# Patient Record
Sex: Female | Born: 1945 | Race: White | Hispanic: No | Marital: Married | Smoking: Former smoker
Health system: Southern US, Community
[De-identification: ages and names within clinical notes are randomized; demographics above are authoritative.]

## PROBLEM LIST (undated history)

## (undated) DIAGNOSIS — C50919 Malignant neoplasm of unspecified site of unspecified female breast: Secondary | ICD-10-CM

## (undated) DIAGNOSIS — H409 Unspecified glaucoma: Secondary | ICD-10-CM

## (undated) HISTORY — DX: Unspecified glaucoma: H40.9

## (undated) HISTORY — PX: TONSILLECTOMY: SUR1361

## (undated) HISTORY — DX: Malignant neoplasm of unspecified site of unspecified female breast: C50.919

## (undated) HISTORY — PX: TUBAL LIGATION: SHX77

---
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
## 1999-04-24 ENCOUNTER — Other Ambulatory Visit: Admission: RE | Admit: 1999-04-24 | Discharge: 1999-04-24 | Payer: Self-pay | Admitting: Gastroenterology

## 1999-12-02 ENCOUNTER — Ambulatory Visit (HOSPITAL_COMMUNITY): Admission: RE | Admit: 1999-12-02 | Discharge: 1999-12-02 | Payer: Self-pay | Admitting: Neurosurgery

## 1999-12-02 ENCOUNTER — Encounter: Payer: Self-pay | Admitting: Neurosurgery

## 2000-01-21 ENCOUNTER — Encounter: Payer: Self-pay | Admitting: Neurosurgery

## 2000-01-21 ENCOUNTER — Ambulatory Visit (HOSPITAL_COMMUNITY): Admission: RE | Admit: 2000-01-21 | Discharge: 2000-01-21 | Payer: Self-pay | Admitting: Neurosurgery

## 2000-06-17 ENCOUNTER — Other Ambulatory Visit: Admission: RE | Admit: 2000-06-17 | Discharge: 2000-06-17 | Payer: Self-pay | Admitting: Obstetrics & Gynecology

## 2001-06-20 ENCOUNTER — Other Ambulatory Visit: Admission: RE | Admit: 2001-06-20 | Discharge: 2001-06-20 | Payer: Self-pay | Admitting: Obstetrics & Gynecology

## 2001-11-06 ENCOUNTER — Encounter: Payer: Self-pay | Admitting: Emergency Medicine

## 2001-11-06 ENCOUNTER — Inpatient Hospital Stay (HOSPITAL_COMMUNITY): Admission: EM | Admit: 2001-11-06 | Discharge: 2001-11-07 | Payer: Self-pay | Admitting: Emergency Medicine

## 2002-06-25 ENCOUNTER — Other Ambulatory Visit: Admission: RE | Admit: 2002-06-25 | Discharge: 2002-06-25 | Payer: Self-pay | Admitting: Obstetrics & Gynecology

## 2003-07-01 ENCOUNTER — Other Ambulatory Visit: Admission: RE | Admit: 2003-07-01 | Discharge: 2003-07-01 | Payer: Self-pay | Admitting: Obstetrics & Gynecology

## 2004-07-24 ENCOUNTER — Other Ambulatory Visit: Admission: RE | Admit: 2004-07-24 | Discharge: 2004-07-24 | Payer: Self-pay | Admitting: Obstetrics & Gynecology

## 2005-08-13 ENCOUNTER — Other Ambulatory Visit: Admission: RE | Admit: 2005-08-13 | Discharge: 2005-08-13 | Payer: Self-pay | Admitting: Obstetrics & Gynecology

## 2006-05-16 ENCOUNTER — Encounter (INDEPENDENT_AMBULATORY_CARE_PROVIDER_SITE_OTHER): Payer: Self-pay | Admitting: Specialist

## 2006-05-16 ENCOUNTER — Ambulatory Visit (HOSPITAL_COMMUNITY): Admission: RE | Admit: 2006-05-16 | Discharge: 2006-05-16 | Payer: Self-pay | Admitting: Obstetrics & Gynecology

## 2006-11-04 ENCOUNTER — Emergency Department (HOSPITAL_COMMUNITY): Admission: EM | Admit: 2006-11-04 | Discharge: 2006-11-04 | Payer: Self-pay | Admitting: Emergency Medicine

## 2008-07-24 ENCOUNTER — Encounter: Admission: RE | Admit: 2008-07-24 | Discharge: 2008-07-24 | Payer: Self-pay | Admitting: Obstetrics & Gynecology

## 2008-07-24 ENCOUNTER — Encounter (INDEPENDENT_AMBULATORY_CARE_PROVIDER_SITE_OTHER): Payer: Self-pay | Admitting: Obstetrics & Gynecology

## 2008-07-24 ENCOUNTER — Encounter (INDEPENDENT_AMBULATORY_CARE_PROVIDER_SITE_OTHER): Payer: Self-pay | Admitting: Diagnostic Radiology

## 2008-08-01 ENCOUNTER — Encounter: Admission: RE | Admit: 2008-08-01 | Discharge: 2008-08-01 | Payer: Self-pay | Admitting: Obstetrics & Gynecology

## 2008-08-02 ENCOUNTER — Encounter: Admission: RE | Admit: 2008-08-02 | Discharge: 2008-08-02 | Payer: Self-pay | Admitting: Surgery

## 2008-08-05 ENCOUNTER — Encounter: Admission: RE | Admit: 2008-08-05 | Discharge: 2008-08-05 | Payer: Self-pay | Admitting: Surgery

## 2008-08-05 ENCOUNTER — Encounter (INDEPENDENT_AMBULATORY_CARE_PROVIDER_SITE_OTHER): Payer: Self-pay | Admitting: Diagnostic Radiology

## 2008-08-05 ENCOUNTER — Encounter: Admission: RE | Admit: 2008-08-05 | Discharge: 2008-08-05 | Payer: Self-pay | Admitting: Obstetrics & Gynecology

## 2008-08-07 ENCOUNTER — Encounter: Admission: RE | Admit: 2008-08-07 | Discharge: 2008-08-07 | Payer: Self-pay | Admitting: Surgery

## 2008-08-07 ENCOUNTER — Encounter (INDEPENDENT_AMBULATORY_CARE_PROVIDER_SITE_OTHER): Payer: Self-pay | Admitting: Surgery

## 2008-08-07 ENCOUNTER — Ambulatory Visit (HOSPITAL_COMMUNITY): Admission: RE | Admit: 2008-08-07 | Discharge: 2008-08-07 | Payer: Self-pay | Admitting: Surgery

## 2008-08-07 HISTORY — PX: BREAST LUMPECTOMY: SHX2

## 2008-08-27 ENCOUNTER — Ambulatory Visit: Payer: Self-pay | Admitting: Oncology

## 2008-08-28 LAB — CBC WITH DIFFERENTIAL/PLATELET
BASO%: 1 % (ref 0.0–2.0)
EOS%: 7.7 % — ABNORMAL HIGH (ref 0.0–7.0)
HCT: 40 % (ref 34.8–46.6)
LYMPH%: 33.3 % (ref 14.0–49.7)
MCH: 32.6 pg (ref 25.1–34.0)
MCHC: 34.4 g/dL (ref 31.5–36.0)
MCV: 94.8 fL (ref 79.5–101.0)
MONO%: 8.5 % (ref 0.0–14.0)
NEUT%: 49.5 % (ref 38.4–76.8)
Platelets: 254 10*3/uL (ref 145–400)
RBC: 4.22 10*6/uL (ref 3.70–5.45)

## 2008-08-29 ENCOUNTER — Encounter: Admission: RE | Admit: 2008-08-29 | Discharge: 2008-08-29 | Payer: Self-pay | Admitting: Oncology

## 2008-08-30 LAB — COMPREHENSIVE METABOLIC PANEL
ALT: 29 U/L (ref 0–35)
AST: 30 U/L (ref 0–37)
Alkaline Phosphatase: 63 U/L (ref 39–117)
CO2: 26 mEq/L (ref 19–32)
Creatinine, Ser: 0.85 mg/dL (ref 0.40–1.20)
Sodium: 141 mEq/L (ref 135–145)
Total Bilirubin: 1.1 mg/dL (ref 0.3–1.2)

## 2008-08-30 LAB — LACTATE DEHYDROGENASE: LDH: 193 U/L (ref 94–250)

## 2008-09-05 ENCOUNTER — Ambulatory Visit: Admission: RE | Admit: 2008-09-05 | Discharge: 2008-10-04 | Payer: Self-pay | Admitting: Radiation Oncology

## 2008-09-05 ENCOUNTER — Ambulatory Visit (HOSPITAL_COMMUNITY): Admission: RE | Admit: 2008-09-05 | Discharge: 2008-09-05 | Payer: Self-pay | Admitting: Oncology

## 2008-09-12 LAB — CBC WITH DIFFERENTIAL/PLATELET
Basophils Absolute: 0.1 10*3/uL (ref 0.0–0.1)
Eosinophils Absolute: 0.4 10*3/uL (ref 0.0–0.5)
HGB: 13.8 g/dL (ref 11.6–15.9)
LYMPH%: 35.1 % (ref 14.0–49.7)
MONO#: 0.4 10*3/uL (ref 0.1–0.9)
NEUT#: 2 10*3/uL (ref 1.5–6.5)
Platelets: 241 10*3/uL (ref 145–400)
RBC: 4.21 10*6/uL (ref 3.70–5.45)
WBC: 4.4 10*3/uL (ref 3.9–10.3)

## 2008-09-13 LAB — COMPREHENSIVE METABOLIC PANEL
AST: 30 U/L (ref 0–37)
Albumin: 4.7 g/dL (ref 3.5–5.2)
BUN: 16 mg/dL (ref 6–23)
Calcium: 9.8 mg/dL (ref 8.4–10.5)
Chloride: 104 mEq/L (ref 96–112)
Glucose, Bld: 108 mg/dL — ABNORMAL HIGH (ref 70–99)
Potassium: 4.1 mEq/L (ref 3.5–5.3)
Sodium: 141 mEq/L (ref 135–145)
Total Protein: 7.4 g/dL (ref 6.0–8.3)

## 2008-09-13 LAB — VITAMIN D 25 HYDROXY (VIT D DEFICIENCY, FRACTURES): Vit D, 25-Hydroxy: 54 ng/mL (ref 30–89)

## 2008-09-23 LAB — BASIC METABOLIC PANEL
CO2: 22 mEq/L (ref 19–32)
Calcium: 9.4 mg/dL (ref 8.4–10.5)
Chloride: 102 mEq/L (ref 96–112)
Glucose, Bld: 123 mg/dL — ABNORMAL HIGH (ref 70–99)
Sodium: 137 mEq/L (ref 135–145)

## 2008-09-23 LAB — CBC WITH DIFFERENTIAL/PLATELET
Eosinophils Absolute: 0 10*3/uL (ref 0.0–0.5)
HCT: 39.5 % (ref 34.8–46.6)
LYMPH%: 9.9 % — ABNORMAL LOW (ref 14.0–49.7)
MONO#: 0.5 10*3/uL (ref 0.1–0.9)
NEUT#: 14.7 10*3/uL — ABNORMAL HIGH (ref 1.5–6.5)
NEUT%: 86.7 % — ABNORMAL HIGH (ref 38.4–76.8)
Platelets: 222 10*3/uL (ref 145–400)
WBC: 16.9 10*3/uL — ABNORMAL HIGH (ref 3.9–10.3)
lymph#: 1.7 10*3/uL (ref 0.9–3.3)

## 2008-10-07 LAB — COMPREHENSIVE METABOLIC PANEL
ALT: 29 U/L (ref 0–35)
AST: 23 U/L (ref 0–37)
Alkaline Phosphatase: 67 U/L (ref 39–117)
BUN: 14 mg/dL (ref 6–23)
Calcium: 9.6 mg/dL (ref 8.4–10.5)
Chloride: 109 mEq/L (ref 96–112)
Creatinine, Ser: 0.65 mg/dL (ref 0.40–1.20)
Total Bilirubin: 0.5 mg/dL (ref 0.3–1.2)

## 2008-10-07 LAB — CBC WITH DIFFERENTIAL/PLATELET
BASO%: 0.1 % (ref 0.0–2.0)
Basophils Absolute: 0 10*3/uL (ref 0.0–0.1)
EOS%: 0.1 % (ref 0.0–7.0)
HCT: 33.5 % — ABNORMAL LOW (ref 34.8–46.6)
HGB: 11.7 g/dL (ref 11.6–15.9)
LYMPH%: 8.2 % — ABNORMAL LOW (ref 14.0–49.7)
MCH: 32.1 pg (ref 25.1–34.0)
MCHC: 34.9 g/dL (ref 31.5–36.0)
MCV: 91.8 fL (ref 79.5–101.0)
MONO%: 3.6 % (ref 0.0–14.0)
NEUT%: 88 % — ABNORMAL HIGH (ref 38.4–76.8)
lymph#: 0.8 10*3/uL — ABNORMAL LOW (ref 0.9–3.3)

## 2008-10-10 ENCOUNTER — Ambulatory Visit: Payer: Self-pay | Admitting: Oncology

## 2008-10-14 LAB — CBC WITH DIFFERENTIAL/PLATELET
BASO%: 0.3 % (ref 0.0–2.0)
Basophils Absolute: 0 10*3/uL (ref 0.0–0.1)
EOS%: 0.8 % (ref 0.0–7.0)
HCT: 35 % (ref 34.8–46.6)
HGB: 12.1 g/dL (ref 11.6–15.9)
LYMPH%: 13.7 % — ABNORMAL LOW (ref 14.0–49.7)
MCH: 32.7 pg (ref 25.1–34.0)
MCHC: 34.5 g/dL (ref 31.5–36.0)
MCV: 94.6 fL (ref 79.5–101.0)
NEUT%: 72.9 % (ref 38.4–76.8)
Platelets: 229 10*3/uL (ref 145–400)
lymph#: 1.3 10*3/uL (ref 0.9–3.3)

## 2008-10-28 LAB — COMPREHENSIVE METABOLIC PANEL
ALT: 19 U/L (ref 0–35)
Albumin: 4.2 g/dL (ref 3.5–5.2)
CO2: 21 mEq/L (ref 19–32)
Calcium: 9.5 mg/dL (ref 8.4–10.5)
Chloride: 105 mEq/L (ref 96–112)
Creatinine, Ser: 0.61 mg/dL (ref 0.40–1.20)
Potassium: 4 mEq/L (ref 3.5–5.3)

## 2008-10-28 LAB — CBC WITH DIFFERENTIAL/PLATELET
Eosinophils Absolute: 0 10*3/uL (ref 0.0–0.5)
HCT: 33 % — ABNORMAL LOW (ref 34.8–46.6)
LYMPH%: 10.7 % — ABNORMAL LOW (ref 14.0–49.7)
MONO#: 0.7 10*3/uL (ref 0.1–0.9)
NEUT#: 8.4 10*3/uL — ABNORMAL HIGH (ref 1.5–6.5)
NEUT%: 82.3 % — ABNORMAL HIGH (ref 38.4–76.8)
Platelets: 238 10*3/uL (ref 145–400)
RBC: 3.54 10*6/uL — ABNORMAL LOW (ref 3.70–5.45)
WBC: 10.2 10*3/uL (ref 3.9–10.3)
nRBC: 0 % (ref 0–0)

## 2008-11-04 LAB — CBC WITH DIFFERENTIAL/PLATELET
BASO%: 0.6 % (ref 0.0–2.0)
EOS%: 1.4 % (ref 0.0–7.0)
HCT: 33.4 % — ABNORMAL LOW (ref 34.8–46.6)
MCHC: 34.3 g/dL (ref 31.5–36.0)
MONO#: 0.8 10*3/uL (ref 0.1–0.9)
RBC: 3.45 10*6/uL — ABNORMAL LOW (ref 3.70–5.45)
RDW: 15.5 % — ABNORMAL HIGH (ref 11.2–14.5)
WBC: 5.8 10*3/uL (ref 3.9–10.3)
lymph#: 1.1 10*3/uL (ref 0.9–3.3)

## 2008-11-19 LAB — CBC WITH DIFFERENTIAL/PLATELET
Basophils Absolute: 0.1 10*3/uL (ref 0.0–0.1)
Eosinophils Absolute: 0 10*3/uL (ref 0.0–0.5)
HGB: 11.6 g/dL (ref 11.6–15.9)
MCV: 96.9 fL (ref 79.5–101.0)
MONO%: 10.9 % (ref 0.0–14.0)
NEUT#: 2 10*3/uL (ref 1.5–6.5)
RDW: 16 % — ABNORMAL HIGH (ref 11.2–14.5)

## 2008-11-19 LAB — COMPREHENSIVE METABOLIC PANEL
Albumin: 4.1 g/dL (ref 3.5–5.2)
CO2: 21 mEq/L (ref 19–32)
Glucose, Bld: 116 mg/dL — ABNORMAL HIGH (ref 70–99)
Potassium: 3.8 mEq/L (ref 3.5–5.3)
Sodium: 141 mEq/L (ref 135–145)
Total Protein: 6.4 g/dL (ref 6.0–8.3)

## 2008-11-25 ENCOUNTER — Ambulatory Visit: Payer: Self-pay | Admitting: Oncology

## 2008-11-25 LAB — CBC WITH DIFFERENTIAL/PLATELET
Eosinophils Absolute: 0.1 10*3/uL (ref 0.0–0.5)
MONO#: 0.4 10*3/uL (ref 0.1–0.9)
NEUT#: 1.3 10*3/uL — ABNORMAL LOW (ref 1.5–6.5)
RBC: 3.34 10*6/uL — ABNORMAL LOW (ref 3.70–5.45)
RDW: 15 % — ABNORMAL HIGH (ref 11.2–14.5)
WBC: 2.9 10*3/uL — ABNORMAL LOW (ref 3.9–10.3)

## 2008-11-27 ENCOUNTER — Ambulatory Visit: Admission: RE | Admit: 2008-11-27 | Discharge: 2009-02-05 | Payer: Self-pay | Admitting: Radiation Oncology

## 2009-01-30 ENCOUNTER — Ambulatory Visit: Payer: Self-pay | Admitting: Oncology

## 2009-02-03 LAB — COMPREHENSIVE METABOLIC PANEL
Albumin: 4.4 g/dL (ref 3.5–5.2)
Alkaline Phosphatase: 59 U/L (ref 39–117)
BUN: 12 mg/dL (ref 6–23)
Creatinine, Ser: 0.73 mg/dL (ref 0.40–1.20)
Glucose, Bld: 108 mg/dL — ABNORMAL HIGH (ref 70–99)
Potassium: 3.9 mEq/L (ref 3.5–5.3)
Total Bilirubin: 0.9 mg/dL (ref 0.3–1.2)

## 2009-02-03 LAB — CBC WITH DIFFERENTIAL/PLATELET
Basophils Absolute: 0 10*3/uL (ref 0.0–0.1)
Eosinophils Absolute: 0.1 10*3/uL (ref 0.0–0.5)
HCT: 37.3 % (ref 34.8–46.6)
HGB: 12.9 g/dL (ref 11.6–15.9)
LYMPH%: 21.1 % (ref 14.0–49.7)
MCV: 96.1 fL (ref 79.5–101.0)
MONO%: 12.3 % (ref 0.0–14.0)
NEUT#: 1.7 10*3/uL (ref 1.5–6.5)
NEUT%: 61.6 % (ref 38.4–76.8)
Platelets: 158 10*3/uL (ref 145–400)

## 2009-02-27 ENCOUNTER — Ambulatory Visit: Payer: Self-pay | Admitting: Oncology

## 2009-03-17 ENCOUNTER — Encounter: Admission: RE | Admit: 2009-03-17 | Discharge: 2009-03-17 | Payer: Self-pay | Admitting: Surgery

## 2009-03-18 LAB — BASIC METABOLIC PANEL
CO2: 28 mEq/L (ref 19–32)
Calcium: 9.7 mg/dL (ref 8.4–10.5)
Creatinine, Ser: 1.29 mg/dL — ABNORMAL HIGH (ref 0.40–1.20)
Glucose, Bld: 122 mg/dL — ABNORMAL HIGH (ref 70–99)

## 2009-04-02 ENCOUNTER — Ambulatory Visit: Payer: Self-pay | Admitting: Oncology

## 2009-04-11 LAB — CBC WITH DIFFERENTIAL/PLATELET
BASO%: 0.8 % (ref 0.0–2.0)
Eosinophils Absolute: 0.1 10*3/uL (ref 0.0–0.5)
HGB: 13.2 g/dL (ref 11.6–15.9)
LYMPH%: 34.6 % (ref 14.0–49.7)
MCHC: 33.4 g/dL (ref 31.5–36.0)
MONO#: 0.3 10*3/uL (ref 0.1–0.9)
Platelets: 172 10*3/uL (ref 145–400)
RBC: 4.06 10*6/uL (ref 3.70–5.45)
RDW: 13.8 % (ref 11.2–14.5)
lymph#: 1.3 10*3/uL (ref 0.9–3.3)

## 2009-04-11 LAB — COMPREHENSIVE METABOLIC PANEL
ALT: 16 U/L (ref 0–35)
Albumin: 4.7 g/dL (ref 3.5–5.2)
Alkaline Phosphatase: 59 U/L (ref 39–117)
CO2: 29 mEq/L (ref 19–32)
Glucose, Bld: 121 mg/dL — ABNORMAL HIGH (ref 70–99)
Potassium: 4.2 mEq/L (ref 3.5–5.3)
Sodium: 143 mEq/L (ref 135–145)
Total Bilirubin: 1.2 mg/dL (ref 0.3–1.2)
Total Protein: 6.9 g/dL (ref 6.0–8.3)

## 2009-04-11 LAB — CANCER ANTIGEN 27.29: CA 27.29: 24 U/mL (ref 0–39)

## 2009-07-07 ENCOUNTER — Ambulatory Visit: Payer: Self-pay | Admitting: Oncology

## 2009-07-09 LAB — CBC WITH DIFFERENTIAL/PLATELET
Basophils Absolute: 0 10*3/uL (ref 0.0–0.1)
EOS%: 1.8 % (ref 0.0–7.0)
HCT: 37.5 % (ref 34.8–46.6)
HGB: 13.1 g/dL (ref 11.6–15.9)
LYMPH%: 23 % (ref 14.0–49.7)
MCH: 34 pg (ref 25.1–34.0)
MCHC: 34.8 g/dL (ref 31.5–36.0)
NEUT%: 66.8 % (ref 38.4–76.8)
Platelets: 191 10*3/uL (ref 145–400)
lymph#: 0.9 10*3/uL (ref 0.9–3.3)

## 2009-07-09 LAB — LACTATE DEHYDROGENASE: LDH: 194 U/L (ref 94–250)

## 2009-07-09 LAB — COMPREHENSIVE METABOLIC PANEL
AST: 30 U/L (ref 0–37)
BUN: 13 mg/dL (ref 6–23)
CO2: 22 mEq/L (ref 19–32)
Calcium: 9.3 mg/dL (ref 8.4–10.5)
Chloride: 105 mEq/L (ref 96–112)
Creatinine, Ser: 0.69 mg/dL (ref 0.40–1.20)
Total Bilirubin: 1 mg/dL (ref 0.3–1.2)

## 2009-07-09 LAB — VITAMIN D 25 HYDROXY (VIT D DEFICIENCY, FRACTURES): Vit D, 25-Hydroxy: 83 ng/mL (ref 30–89)

## 2009-07-28 ENCOUNTER — Encounter: Admission: RE | Admit: 2009-07-28 | Discharge: 2009-07-28 | Payer: Self-pay | Admitting: Oncology

## 2009-12-19 ENCOUNTER — Ambulatory Visit: Payer: Self-pay | Admitting: Oncology

## 2009-12-24 LAB — CBC WITH DIFFERENTIAL/PLATELET
Basophils Absolute: 0 10*3/uL (ref 0.0–0.1)
Eosinophils Absolute: 0.2 10*3/uL (ref 0.0–0.5)
HGB: 12.9 g/dL (ref 11.6–15.9)
MCV: 97.5 fL (ref 79.5–101.0)
MONO%: 8.8 % (ref 0.0–14.0)
NEUT#: 2.1 10*3/uL (ref 1.5–6.5)
RDW: 12.9 % (ref 11.2–14.5)

## 2009-12-24 LAB — LACTATE DEHYDROGENASE: LDH: 197 U/L (ref 94–250)

## 2009-12-24 LAB — COMPREHENSIVE METABOLIC PANEL
Albumin: 4.6 g/dL (ref 3.5–5.2)
Alkaline Phosphatase: 61 U/L (ref 39–117)
BUN: 15 mg/dL (ref 6–23)
Calcium: 10.2 mg/dL (ref 8.4–10.5)
Chloride: 104 mEq/L (ref 96–112)
Glucose, Bld: 103 mg/dL — ABNORMAL HIGH (ref 70–99)
Potassium: 3.8 mEq/L (ref 3.5–5.3)

## 2009-12-24 LAB — CANCER ANTIGEN 27.29: CA 27.29: 11 U/mL (ref 0–39)

## 2009-12-24 LAB — VITAMIN D 25 HYDROXY (VIT D DEFICIENCY, FRACTURES): Vit D, 25-Hydroxy: 68 ng/mL (ref 30–89)

## 2010-07-11 ENCOUNTER — Other Ambulatory Visit: Payer: Self-pay | Admitting: Oncology

## 2010-07-11 DIAGNOSIS — Z853 Personal history of malignant neoplasm of breast: Secondary | ICD-10-CM

## 2010-07-11 DIAGNOSIS — Z9889 Other specified postprocedural states: Secondary | ICD-10-CM

## 2010-07-12 ENCOUNTER — Encounter: Payer: Self-pay | Admitting: Obstetrics & Gynecology

## 2010-08-06 ENCOUNTER — Other Ambulatory Visit: Payer: Self-pay | Admitting: Oncology

## 2010-08-06 ENCOUNTER — Encounter (HOSPITAL_BASED_OUTPATIENT_CLINIC_OR_DEPARTMENT_OTHER): Payer: 59 | Admitting: Oncology

## 2010-08-06 DIAGNOSIS — C50419 Malignant neoplasm of upper-outer quadrant of unspecified female breast: Secondary | ICD-10-CM

## 2010-08-06 DIAGNOSIS — Z170 Estrogen receptor positive status [ER+]: Secondary | ICD-10-CM

## 2010-08-06 LAB — CBC WITH DIFFERENTIAL/PLATELET
BASO%: 0.9 % (ref 0.0–2.0)
MCHC: 34.9 g/dL (ref 31.5–36.0)
MONO#: 0.4 10*3/uL (ref 0.1–0.9)
NEUT#: 2.2 10*3/uL (ref 1.5–6.5)
RBC: 3.82 10*6/uL (ref 3.70–5.45)
RDW: 13 % (ref 11.2–14.5)
WBC: 4.1 10*3/uL (ref 3.9–10.3)
lymph#: 1.4 10*3/uL (ref 0.9–3.3)

## 2010-08-07 LAB — COMPREHENSIVE METABOLIC PANEL
ALT: 22 U/L (ref 0–35)
Albumin: 4.5 g/dL (ref 3.5–5.2)
CO2: 25 mEq/L (ref 19–32)
Calcium: 9.3 mg/dL (ref 8.4–10.5)
Chloride: 101 mEq/L (ref 96–112)
Potassium: 3.9 mEq/L (ref 3.5–5.3)
Sodium: 136 mEq/L (ref 135–145)
Total Bilirubin: 1 mg/dL (ref 0.3–1.2)
Total Protein: 6.2 g/dL (ref 6.0–8.3)

## 2010-08-07 LAB — LACTATE DEHYDROGENASE: LDH: 165 U/L (ref 94–250)

## 2010-08-13 ENCOUNTER — Encounter (HOSPITAL_BASED_OUTPATIENT_CLINIC_OR_DEPARTMENT_OTHER): Payer: 59 | Admitting: Oncology

## 2010-08-13 DIAGNOSIS — C50419 Malignant neoplasm of upper-outer quadrant of unspecified female breast: Secondary | ICD-10-CM

## 2010-08-13 DIAGNOSIS — Z170 Estrogen receptor positive status [ER+]: Secondary | ICD-10-CM

## 2010-09-02 ENCOUNTER — Other Ambulatory Visit: Payer: Self-pay

## 2010-09-03 ENCOUNTER — Ambulatory Visit
Admission: RE | Admit: 2010-09-03 | Discharge: 2010-09-03 | Disposition: A | Discharge status: Home / Self Care | Payer: 59 | Source: Ambulatory Visit | Attending: Oncology | Admitting: Oncology

## 2010-09-03 DIAGNOSIS — Z9889 Other specified postprocedural states: Secondary | ICD-10-CM

## 2010-09-03 DIAGNOSIS — Z853 Personal history of malignant neoplasm of breast: Secondary | ICD-10-CM

## 2010-10-06 LAB — CBC
HCT: 42.1 % (ref 36.0–46.0)
MCHC: 34.5 g/dL (ref 30.0–36.0)
MCV: 97.3 fL (ref 78.0–100.0)
Platelets: 238 10*3/uL (ref 150–400)
WBC: 5.1 10*3/uL (ref 4.0–10.5)

## 2010-10-06 LAB — COMPREHENSIVE METABOLIC PANEL
Albumin: 4.4 g/dL (ref 3.5–5.2)
BUN: 10 mg/dL (ref 6–23)
Calcium: 9.6 mg/dL (ref 8.4–10.5)
Chloride: 105 mEq/L (ref 96–112)
Creatinine, Ser: 0.89 mg/dL (ref 0.4–1.2)
GFR calc non Af Amer: >60 mL/min (ref >60)
Total Bilirubin: 1.6 mg/dL — ABNORMAL HIGH (ref 0.3–1.2)

## 2010-10-06 LAB — DIFFERENTIAL
Basophils Absolute: 0 10*3/uL (ref 0.0–0.1)
Lymphocytes Relative: 33 % (ref 12–46)
Monocytes Absolute: 0.4 10*3/uL (ref 0.1–1.0)
Neutro Abs: 2.9 10*3/uL (ref 1.7–7.7)

## 2010-11-03 NOTE — Op Note (Signed)
NAMENEFTALY, INZUNZA               ACCOUNT NO.:  [id number]   MEDICAL RECORD NO.:  [id number]          PATIENT TYPE:  AMB   LOCATION:  SDS                          FACILITY:  [facility]   PHYSICIAN:  Thomas A. Cornett, M.D.DATE OF BIRTH:  Nov 11, 1945   DATE OF PROCEDURE:  08/07/2008  DATE OF DISCHARGE:  08/07/2008                               OPERATIVE REPORT   PREOPERATIVE DIAGNOSIS:  T2 N0 Mx right upper outer quadrant breast  lobular carcinoma.   POSTOPERATIVE DIAGNOSIS:  T2 N0 Mx right upper outer quadrant breast  lobular carcinoma.   PROCEDURE:  1. Right breast needle-localized lumpectomy.  2. Right axillary sentinel lymph node mapping with injection of      methylene blue dye.   SURGEON:  Maisie Fus A. Cornett, MD.   ANESTHESIA:  LMA with 0.25% Sensorcaine local.   ESTIMATED BLOOD LOSS:  10 mL.   SPECIMEN:  1. Right breast tissue localizing wire and clip verified by the tissue      specimen radiograph to be adequate.  2. Total of 6 sentinel lymph nodes discovered.  Only 3 were checked by      the NeoProbe and additional 3 are within the fat lobules sent with      the other 3 sentinel lymph nodes.  All 6 were negative by touch      prep analysis.   DRAINS:  None.   INDICATIONS FOR PROCEDURE:  The patient is a 65 year old postmenopausal  female found to have a right breast cancer by core biopsy.  This was ER,  PR-positive, roughly 1.7 cm by ultrasound, ended up being almost 2.9 by  MRI criteria.  Her initial stage in the office was felt T1 N0 Mx but  after her MRI was done after I saw her, I felt she was more likely T2 N0  Mx stage II.  It is ER, PR-positive with a Ki-67 fraction of 8% which is  low.  I talked with her about options to include breast conservative  measures and this is what she wished to do.  Of note, she had an area in  her left breast with core biopsy and found to be done benign earlier  this week.   DESCRIPTION OF PROCEDURE:  After undergoing right breast  wire  localization with the radiologist, she was brought to the holding area  where nuclear medicine injection was performed with technetium sulfur  colloid by the Radiology tech.  She was then taken back to the operating  room after 30 minutes and general anesthesia was induced in the supine  position.  Right breast and axilla were prepped and draped in sterile  fashion.  A 4 mL of methylene blue dye was injected in a subareolar  position and massaged for 5 minutes.  NeoProbe was used and a hot spot  was identified in the right axilla.  The tumor was in the right upper  outer quadrant.  I was able to make one incision for both tumor and  lymph nodes.  This was in the right upper outer quadrant lateral.  An  incision was made after  infiltration with 0.25% Sensorcaine local.  Dissection was carried down to her axilla.  I identified 3 blue hot  nodes.  I took some neighboring fat with them as well and just trimmed  down 3 more nodes with it.  All 6 nodes were negative by touch prep  analysis.  The radioactive background counts were less 10% of the  primary tumor counts in the axilla at this point and there were no other  counts in the breast either medial or superior to the breast.  Next, the  same incision was used and lumpectomy was performed through it.  Tissue  was taken all the down to where I encountered the tumor, which I felt.  I excised the tumor in its entirety.  There was some fibrous changes and  I elected to take some margins as well, took both superior, medial,  inferior, lateral, and superficial margin was actually skin since the  tumor was abutting all these and it was hard to tell tumor apart from  fibrocystic changes.  These were all sent for analysis.  Radiograph  revealed the tumor to be intact with wire and clip in place.  The cavity  was irrigated out.  Hemostasis was achieved with cautery.  I then  approximated the deep part of cavity to the chest wall with 3-0  Vicryl.  I closed the axilla with 3-0 Vicryl as well after irrigating and  inspecting for hemostasis.  A 4-0 Monocryl was then used to close all  skin incisions in a subcuticular fashion.  Dermabond was then applied.  All final counts of sponge, needle, and instruments were found to be  correct at this portion of the case.  The patient was awoken and taken  to recovery in satisfactory condition.      Thomas A. Cornett, M.D.  Electronically Signed     TAC/MEDQ  D:  08/07/2008  T:  08/08/2008  Job:  [id number]   cc:   Ilda Mori, M.D.  Attention [facility]

## 2010-11-06 NOTE — Discharge Summary (Signed)
[place]  Patient:    Maria Noble, Maria Noble Visit Number: [id number] MRN: [id number]          Service Type: MED Location: [phone] Attending Physician:  Lenoria Farrier Dictated by:   Joellyn Rued, P.A.-C. Admit Date:  11/06/2001 Discharge Date: 11/07/2001   CC:         Dario Guardian, M.D. at [facility]. Arlyce Dice, M.D., OB/GYN   Referring Physician Discharge [place]  DATE OF BIRTH:  06/03/1946  SUMMARY OF HISTORY:  Maria Noble is a 65 year old white female who presented to [facility] emergency room on Nov 06, 2001 complaining of chest discomfort. She stated that since her brother had a myocardial infarction and underwent bypass surgery within the last month she has been very anxious about her risk of developing coronary artery disease and having problems.  She stated on the morning of May 19 while at work, sitting at her desk, she developed a pushing sensation in her substernal region.  This did not radiate nor was it associated with shortness of breath, nausea, vomiting, or diaphoresis.  She stated that it was a 1 on a scale of 0-10.  When she paid attention to it she was aware of it; when she was involved in other activities she was not aware of the discomfort.  She seemed to think it became worse with anxiety but it did not change with activity.  She denies any prior history of exertional limitations, chest discomfort, shortness of breath, palpitations, syncope. She has been under a lot of stress with her brothers recent illness and her mothers dementia.  She ambulates 3.2 miles at least three to four times a week.  She quit smoking approximately 14 years ago and is a vegetarian.  Her medical history is essentially benign except for cervical disk surgery, T&A, and tubal ligation.  LABORATORY DATA:  H&H 13.6 and 38.6, normal indices, platelets 246, wbcs 3.8. Sodium 138, potassium 3.7, BUN 9, creatinine 0.8, normal LFTs,  glucose 90. CKs and troponins were negative for myocardial infarction.  Chest x-ray showed COPD findings, no active disease.  EKG showed normal sinus rhythm.  HOSPITAL COURSE:  [name], overnight, ruled out for myocardial infarction by her enzymes and EKGs.  She did have one "twinge" of discomfort.  Dr. Juanda Chance reviewed and felt that she could be discharged home with a stress Cardiolite this afternoon in the office; thus, this was arranged.  DISCHARGE DIAGNOSES: 1. Prolonged atypical chest discomfort. 2. Anxiety.  DISPOSITION:  She is discharged home.  MEDICATIONS:  She was asked to continue her home medications which include: 1. Estratest daily. 2. Nedroxyprogesterone 10 mg days 1-13. 3. Maxzide 7.5/25 as needed. 4. Multivitamin q.d. 5. Folic acid q.d. 6. Ery-Tab 250 mg q.d.  DIET:  Maintain low salt/fat/cholesterol diet.  FOLLOW-UP:  She will have a stress Cardiolite at our office today at 12:30 p.m.  She was asked not to eat or drink anything after the test.  If the test is positive she will be followed up in our office.  If this is negative, the report will be called to her and she will have follow-up with her primary care physician.  It is noted that at time of discharge her fasting lipid panel is pending at the time of this dictation. Dictated by:   Joellyn Rued, P.A.-C. Attending Physician:  Lenoria Farrier DD:  11/07/01 TD:  11/07/01 Job: [id number]

## 2010-11-06 NOTE — H&P (Signed)
[place]. [facility]  Patient:    Maria Noble, Maria Noble Visit Number: [id number] MRN: [id number]          Service Type: MED Location: [phone] Attending Physician:  Lenoria Farrier Dictated by:   Everardo Beals Juanda Chance, M.D. [facility] Admit Date:  11/06/2001   CC:         Darci Needle, M.D.  Cardiopulmonary Laboratory, [facility]  Maria Noble, M.D.   History and Physical  CHIEF COMPLAINT: Chest pain.  HISTORY OF PRESENT ILLNESS: Maria Noble is a very pleasant 65 year old woman, who has no prior history of known heart disease but has a very strong positive family history of heart disease, with a brother who died at 49 of myocardial infarction, another brother who had myocardial infarction at 41 followed by bypass surgery.  Because of her strong family history she was scheduled for a stress test with Dr. Antoine Noble in the near future.  Today while at work she developed some lower substernal discomfort which she described as a pushing sensation, without any associated shortness of breath, nausea, or diaphoresis. These symptoms persisted intermittently over several hours and she called the doctors office at [facility], who advised her to come in to the emergency room.  PAST MEDICAL HISTORY:  1. Previous disk surgery.  2. Tubal ligation.  3. T&A.  Her risk factors for cardiovascular disease are negative for hypertension and tobacco and she quit smoking 14 years ago.  We do not know her lipid status.  CURRENT MEDICATIONS:  1. Triamterene/hydrochlorothiazide 7.5/25 mg p.r.n.  2. Folic acid.  3. Multivitamin.  4. Estratest at bedtime.  5. Medroxyprogesterone 10 mg on days 1 to 13 of her cycle.  6. Erythromycin 250 mg q.d. for rosacea.  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  She has no children, and lives with her husband.  She is a vegetarian.  REVIEW OF SYSTEMS/FAMILY HISTORY: For details please see Maria Noble, P.A.-C.s dictated note.  PHYSICAL EXAMINATION:  VITAL SIGNS: Blood pressure 158/85, pulse 76 and regular.  NECK: No vein distention.  Carotid pulses full without bruits.  Thyroid not enlarged.  CHEST: Clear without rales or rhonchi.  HEART: Cardiac rhythm was regular.  Heart sounds were normal.  There was a 1/6 systolic ejection murmur at the left sternal edge.  There were no gallops.  ABDOMEN: Soft, with normal bowel sounds.  There was no hepatosplenomegaly.  EXTREMITIES: Peripheral pulses full.  No peripheral edema.  MUSCULOSKELETAL: No deformities.  SKIN: Warm and dry.  NEUROLOGIC: No focal signs.  LABORATORY DATA: Chest x-ray showed no active disease.  EKG was normal.  Initial troponins were negative.  IMPRESSION:  1. Chest pain somewhat atypical for ischemia but persistent.  2. Markedly positive family history for coronary heart disease.  PLAN: The patients chest pain is nonexertional and in this end somewhat atypical for ischemia.  She does have a very strong positive family history and was concerned enough to come to the emergency room.  We will plan to admit her for observation and plan an outpatient Cardiolite scan tomorrow if her enzymes and EKGs remain negative. Dictated by:   Everardo Beals Juanda Chance, M.D. [facility] Attending Physician:  Lenoria Farrier DD:  11/06/01 TD:  11/07/01 Job: [id number]

## 2011-02-11 IMAGING — CT CT CHEST W/ CM
2 of 3 series · 15 of 36 positions shown, 18 images · IV contrast (75CC OMNI 300)
Comparison: Chest x-ray 08/02/2008

CLINICAL DATA: Abnormal chest x-ray 08/02/2008 with possible
pulmonary nodule or sclerotic bone lesion.

CT CHEST WITH CONTRAST
TECHNIQUE: Multidetector CT imaging of the chest was performed
following the standard protocol during bolus administration of
intravenous contrast.
Contrast: 75 ml Hmnipaque-MCC

[Series 2: chest w/ · axial · 0.59mm/px · z∈[-355,-60]mm · 12 of 71 slices shown, 15 images]
[im 6/71  mediastinal]
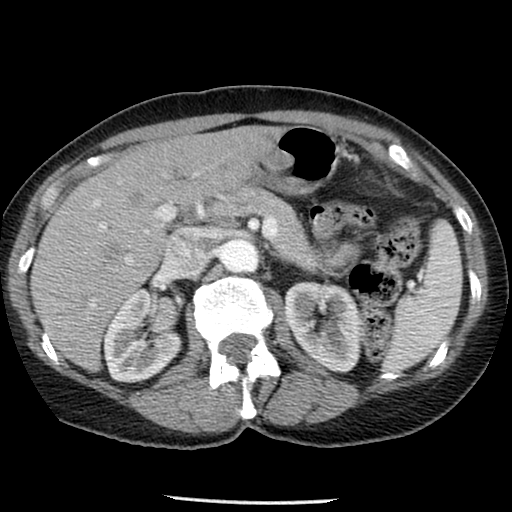
[im 6/71  lung]
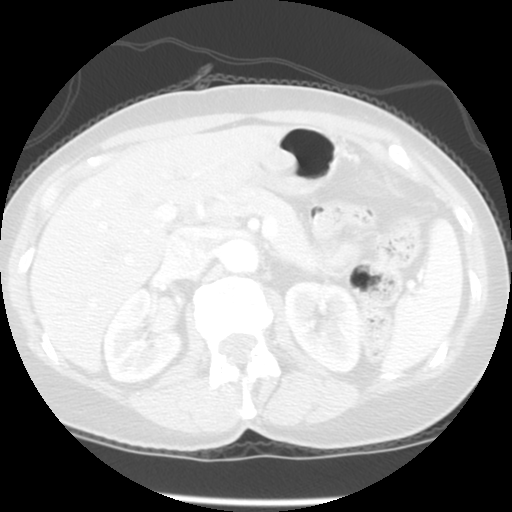
[im 11/71  lung]
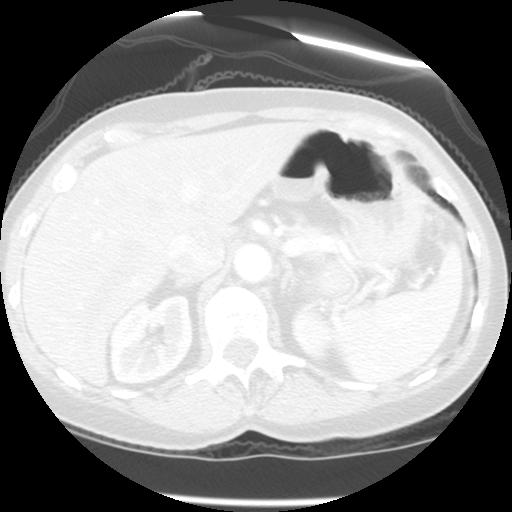
[im 16/71  lung]
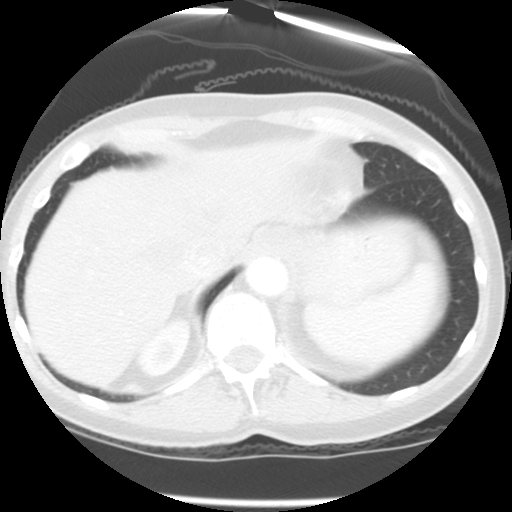
[im 21/71  lung]
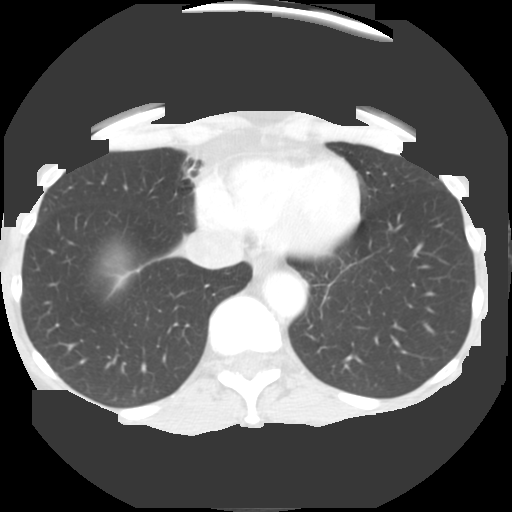
[im 26/71  mediastinal]
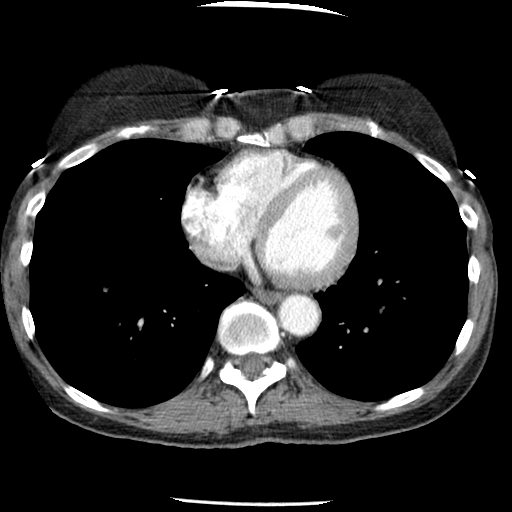
[im 26/71  lung]
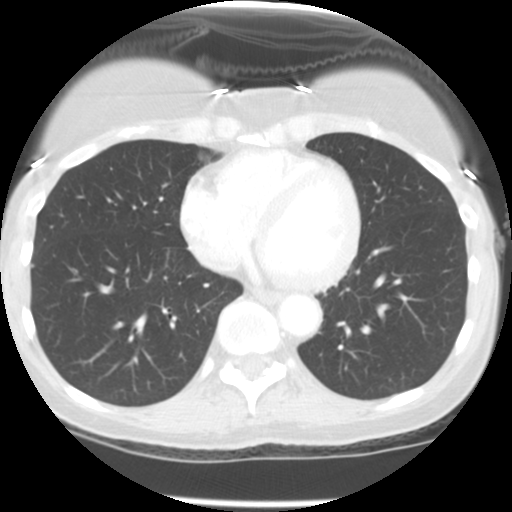
[im 32/71  lung]
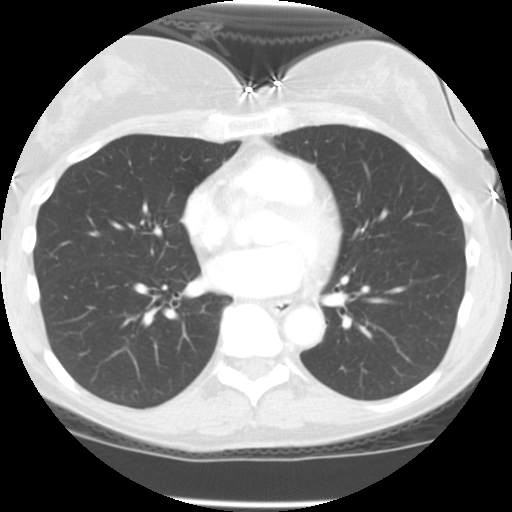
[im 39/71  lung]
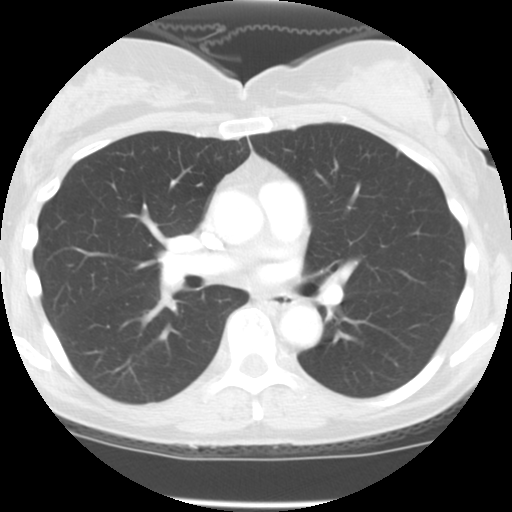
[im 45/71  lung]
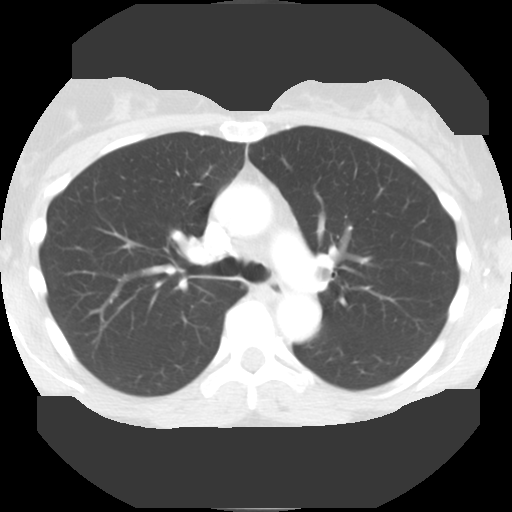
[im 50/71  mediastinal]
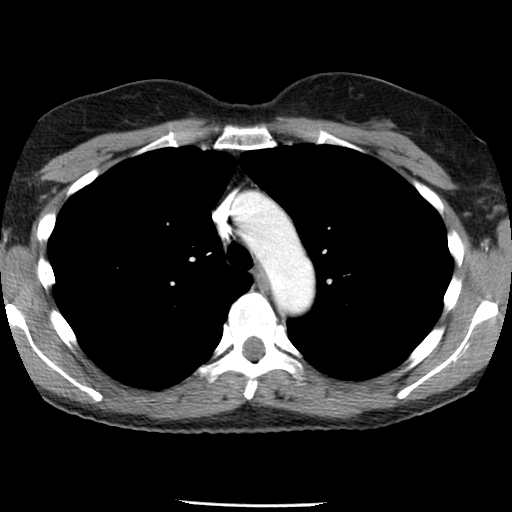
[im 50/71  lung]
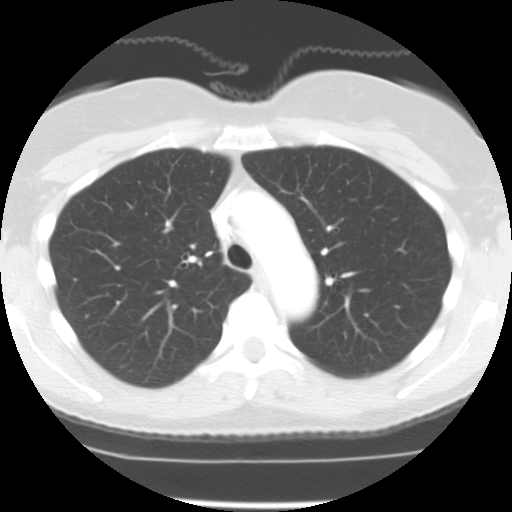
[im 55/71  lung]
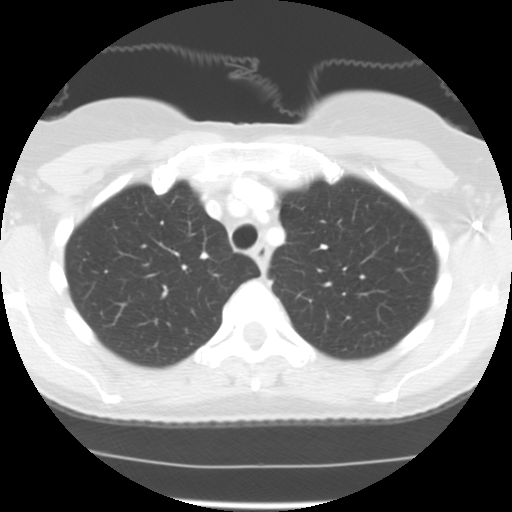
[im 60/71  lung]
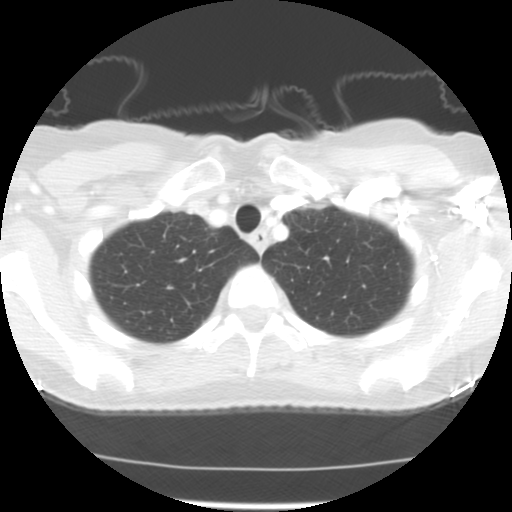
[im 65/71  lung]
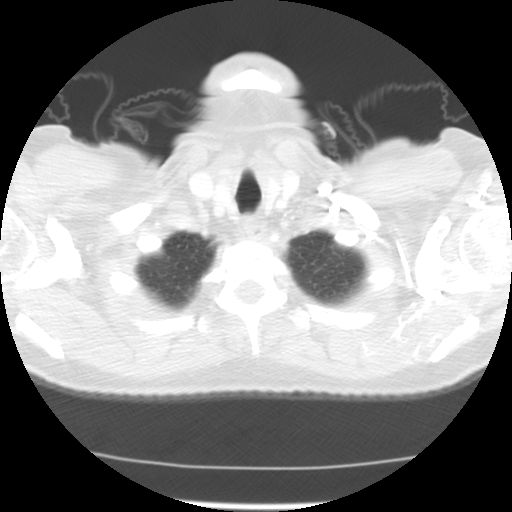

[Series 400: cor chest · coronal · 0.71mm/px · 3 of 101 slices shown]
[im 21/101  lung]
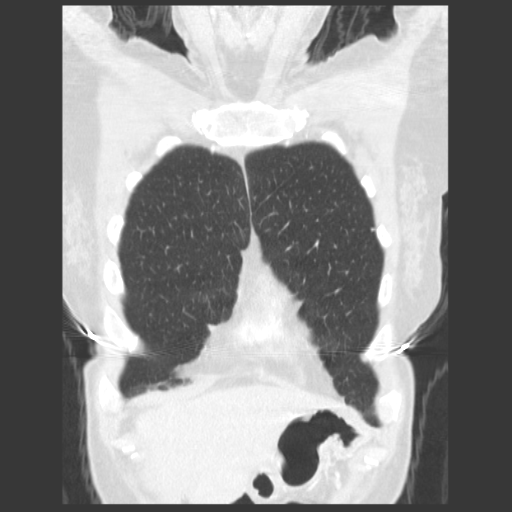
[im 41/101  lung]
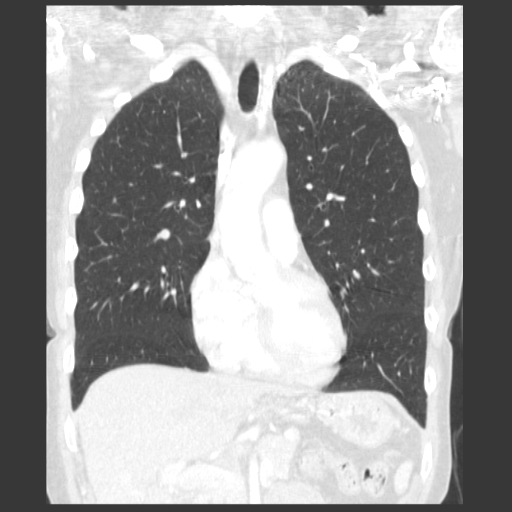
[im 61/101  lung]
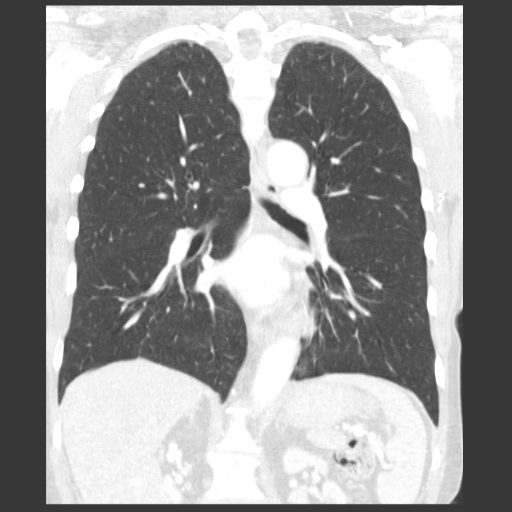

[15 of 36 positions shown; findings below may reference images not displayed]

FINDINGS: No pathologically enlarged mediastinal, hilar, axillary
or internal mammary lymph nodes.  Heart size normal.  No
pericardial effusion.

Tiny subpleural nodule in the right lower lobe is nonspecific
(image 55).  Subpleural lymph node is seen along the left major
fissure.  Lungs otherwise clear.  No pleural fluid.  Airway is
unremarkable.

Incidental imaging of the upper abdomen shows no acute findings.
No worrisome lytic or sclerotic lesions.
IMPRESSION: No CT findings to explain the questioned abnormality on chest x-ray
08/02/2008.

REF:G3 DICTATED: 08/05/2008 [DATE]

## 2011-02-12 ENCOUNTER — Other Ambulatory Visit: Payer: Self-pay | Admitting: Oncology

## 2011-02-12 ENCOUNTER — Encounter (HOSPITAL_BASED_OUTPATIENT_CLINIC_OR_DEPARTMENT_OTHER): Payer: 59 | Admitting: Oncology

## 2011-02-12 DIAGNOSIS — Z170 Estrogen receptor positive status [ER+]: Secondary | ICD-10-CM

## 2011-02-12 DIAGNOSIS — C50419 Malignant neoplasm of upper-outer quadrant of unspecified female breast: Secondary | ICD-10-CM

## 2011-02-12 LAB — CBC WITH DIFFERENTIAL/PLATELET
Basophils Absolute: 0 10*3/uL (ref 0.0–0.1)
HCT: 40 % (ref 34.8–46.6)
HGB: 13.8 g/dL (ref 11.6–15.9)
MONO#: 0.3 10*3/uL (ref 0.1–0.9)
NEUT%: 67.6 % (ref 38.4–76.8)
WBC: 4.6 10*3/uL (ref 3.9–10.3)
lymph#: 1.1 10*3/uL (ref 0.9–3.3)

## 2011-02-13 LAB — COMPREHENSIVE METABOLIC PANEL
ALT: 27 U/L (ref 0–35)
AST: 38 U/L — ABNORMAL HIGH (ref 0–37)
Creatinine, Ser: 0.8 mg/dL (ref 0.50–1.10)
Sodium: 140 mEq/L (ref 135–145)
Total Bilirubin: 0.9 mg/dL (ref 0.3–1.2)
Total Protein: 7.1 g/dL (ref 6.0–8.3)

## 2011-07-10 ENCOUNTER — Telehealth: Payer: Self-pay | Admitting: Oncology

## 2011-07-10 NOTE — Telephone Encounter (Signed)
called pts home lmovm for appt for [id number]. asked pt to rtn call to confirm appt

## 2011-07-12 ENCOUNTER — Telehealth: Payer: Self-pay | Admitting: Oncology

## 2011-07-12 NOTE — Telephone Encounter (Signed)
pt rtn call and confirmed appts for [phone]

## 2011-07-27 ENCOUNTER — Other Ambulatory Visit: Payer: Self-pay | Admitting: Oncology

## 2011-07-27 DIAGNOSIS — Z853 Personal history of malignant neoplasm of breast: Secondary | ICD-10-CM

## 2011-08-27 ENCOUNTER — Ambulatory Visit: Payer: 59 | Admitting: Oncology

## 2011-08-27 ENCOUNTER — Other Ambulatory Visit: Payer: 59

## 2011-09-02 ENCOUNTER — Ambulatory Visit (HOSPITAL_BASED_OUTPATIENT_CLINIC_OR_DEPARTMENT_OTHER): Payer: 59 | Admitting: Oncology

## 2011-09-02 ENCOUNTER — Other Ambulatory Visit (HOSPITAL_BASED_OUTPATIENT_CLINIC_OR_DEPARTMENT_OTHER): Payer: 59 | Admitting: Lab

## 2011-09-02 ENCOUNTER — Ambulatory Visit: Payer: 59 | Admitting: Oncology

## 2011-09-02 ENCOUNTER — Other Ambulatory Visit: Payer: 59 | Admitting: Lab

## 2011-09-02 VITALS — BP 126/78 | HR 80 | Temp 98.7°F | Ht 67.5 in | Wt 146.7 lb

## 2011-09-02 DIAGNOSIS — C50419 Malignant neoplasm of upper-outer quadrant of unspecified female breast: Secondary | ICD-10-CM

## 2011-09-02 DIAGNOSIS — C50919 Malignant neoplasm of unspecified site of unspecified female breast: Secondary | ICD-10-CM

## 2011-09-02 DIAGNOSIS — E559 Vitamin D deficiency, unspecified: Secondary | ICD-10-CM

## 2011-09-02 DIAGNOSIS — Z170 Estrogen receptor positive status [ER+]: Secondary | ICD-10-CM

## 2011-09-02 LAB — CBC WITH DIFFERENTIAL/PLATELET
BASO%: 0.9 % (ref 0.0–2.0)
HCT: 38.5 % (ref 34.8–46.6)
MCHC: 34.2 g/dL (ref 31.5–36.0)
MONO#: 0.4 10*3/uL (ref 0.1–0.9)
NEUT%: 59.7 % (ref 38.4–76.8)
WBC: 4.2 10*3/uL (ref 3.9–10.3)
lymph#: 1.2 10*3/uL (ref 0.9–3.3)

## 2011-09-02 LAB — COMPREHENSIVE METABOLIC PANEL
ALT: 19 U/L (ref 0–35)
Albumin: 4.2 g/dL (ref 3.5–5.2)
CO2: 32 mEq/L (ref 19–32)
Calcium: 9.6 mg/dL (ref 8.4–10.5)
Chloride: 104 mEq/L (ref 96–112)
Creatinine, Ser: 0.7 mg/dL (ref 0.50–1.10)
Potassium: 3.8 mEq/L (ref 3.5–5.3)
Sodium: 142 mEq/L (ref 135–145)
Total Protein: 7 g/dL (ref 6.0–8.3)

## 2011-09-02 NOTE — Progress Notes (Signed)
Hematology and Oncology Follow Up Visit  Maria Noble [id number] Mar 10, 1946 66 y.o. 09/02/2011 4:45 PM PCPdr R Arlyce Dice  Principle Diagnosis: :  History of T2 N1, ER/PR positive, HER-2 negative breast cancer status post right lumpectomy, sentinel lymph node dissection status post four cycles of q.3 week Taxotere, Cytoxan and radiation therapy, completed 01/28/2009, on Femara  Interim History:  There have been no intercurrent illness, hospitalizations or medication changes.  Medications: I have reviewed the patient's current medications.  Allergies:  Allergies  Allergen Reactions  . Penicillins     Past Medical History, Surgical history, Social history, and Family History were reviewed and updated.  Review of Systems: Constitutional:  Negative for fever, chills, night sweats, anorexia, weight loss, pain. Cardiovascular: no chest pain or dyspnea on exertion Respiratory: no cough, shortness of breath, or wheezing Neurological: negative Dermatological: negative ENT: negative Skin Gastrointestinal: negative Genito-Urinary: negative Hematological and Lymphatic: negative Breast: negative Musculoskeletal: negative Remaining ROS negative.  Physical Exam: Blood pressure 126/78, pulse 80, temperature 98.7 F (37.1 C), temperature source Oral, height 5' 7.5" (1.715 m), weight 146 lb 11.2 oz (66.543 kg). ECOG: 0 General appearance: alert, cooperative and appears stated age Head: Normocephalic, without obvious abnormality, atraumatic Neck: no adenopathy, no carotid bruit, no JVD, supple, symmetrical, trachea midline and thyroid not enlarged, symmetric, no tenderness/mass/nodules Lymph nodes: Cervical, supraclavicular, and axillary nodes normal. Cardiac : regular rate and rhythm, no murmurs or gallops Pulmonary:clear to auscultation bilaterally and normal percussion bilaterally Breasts: inspection negative, no nipple discharge or bleeding, no masses or nodularity palpable Abdomen:soft,  non-tender; bowel sounds normal; no masses,  no organomegaly Extremities negative Neuro: alert, oriented, normal speech, no focal findings or movement disorder noted  Lab Results: Lab Results  Component Value Date   WBC 4.2 09/02/2011   HGB 13.2 09/02/2011   HCT 38.5 09/02/2011   MCV 95.9 09/02/2011   PLT 217 09/02/2011     Chemistry      Component Value Date/Time   NA 140 02/12/2011 1102   NA 140 02/12/2011 1102   K 4.2 02/12/2011 1102   K 4.2 02/12/2011 1102   CL 103 02/12/2011 1102   CL 103 02/12/2011 1102   CO2 23 02/12/2011 1102   CO2 23 02/12/2011 1102   BUN 9 02/12/2011 1102   BUN 9 02/12/2011 1102   CREATININE 0.80 02/12/2011 1102   CREATININE 0.80 02/12/2011 1102      Component Value Date/Time   CALCIUM 10.5 02/12/2011 1102   CALCIUM 10.5 02/12/2011 1102   ALKPHOS 70 02/12/2011 1102   ALKPHOS 70 02/12/2011 1102   AST 38* 02/12/2011 1102   AST 38* 02/12/2011 1102   ALT 27 02/12/2011 1102   ALT 27 02/12/2011 1102   BILITOT 0.9 02/12/2011 1102   BILITOT 0.9 02/12/2011 1102      .pathology. Radiological Studies: chest X-ray n/a Mammogram n/a Bone density n/a  Impression and Plan: Patient is doing, no clinical evidence of recurrence. Performance status is excellent. She'll continue on Femara. I will see her in 6 months time.  More than 50% of the visit was spent in patient-related counselling   Pierce Crane, MD 3/14/20134:45 PM

## 2011-09-03 LAB — VITAMIN D 25 HYDROXY (VIT D DEFICIENCY, FRACTURES): Vit D, 25-Hydroxy: 67 ng/mL (ref 30–89)

## 2011-09-08 ENCOUNTER — Ambulatory Visit
Admission: RE | Admit: 2011-09-08 | Discharge: 2011-09-08 | Disposition: A | Discharge status: Home / Self Care | Payer: 59 | Source: Ambulatory Visit | Attending: Oncology | Admitting: Oncology

## 2011-09-08 DIAGNOSIS — Z853 Personal history of malignant neoplasm of breast: Secondary | ICD-10-CM

## 2011-11-09 ENCOUNTER — Other Ambulatory Visit: Payer: Self-pay | Admitting: Oncology

## 2011-11-09 DIAGNOSIS — C801 Malignant (primary) neoplasm, unspecified: Secondary | ICD-10-CM

## 2012-02-08 ENCOUNTER — Other Ambulatory Visit: Payer: Self-pay | Admitting: *Deleted

## 2012-02-08 DIAGNOSIS — C801 Malignant (primary) neoplasm, unspecified: Secondary | ICD-10-CM

## 2012-02-08 MED ORDER — LETROZOLE 2.5 MG PO TABS: 2.5000 mg | ORAL_TABLET | Freq: Every day | ORAL | Status: DC

## 2012-03-07 ENCOUNTER — Ambulatory Visit: Payer: 59 | Admitting: Oncology

## 2012-03-07 ENCOUNTER — Other Ambulatory Visit: Payer: 59 | Admitting: Lab

## 2012-03-07 NOTE — Progress Notes (Signed)
[facility] today.  Letter mailed to patient.  

## 2012-03-16 ENCOUNTER — Telehealth: Payer: Self-pay | Admitting: *Deleted

## 2012-03-16 ENCOUNTER — Ambulatory Visit (HOSPITAL_BASED_OUTPATIENT_CLINIC_OR_DEPARTMENT_OTHER): Payer: 59 | Admitting: Oncology

## 2012-03-16 ENCOUNTER — Other Ambulatory Visit (HOSPITAL_BASED_OUTPATIENT_CLINIC_OR_DEPARTMENT_OTHER): Payer: 59 | Admitting: Lab

## 2012-03-16 ENCOUNTER — Other Ambulatory Visit: Payer: Self-pay | Admitting: *Deleted

## 2012-03-16 VITALS — BP 130/84 | HR 74 | Temp 98.7°F | Resp 20 | Ht 67.5 in | Wt 134.9 lb

## 2012-03-16 DIAGNOSIS — E559 Vitamin D deficiency, unspecified: Secondary | ICD-10-CM

## 2012-03-16 DIAGNOSIS — C50919 Malignant neoplasm of unspecified site of unspecified female breast: Secondary | ICD-10-CM

## 2012-03-16 DIAGNOSIS — C50419 Malignant neoplasm of upper-outer quadrant of unspecified female breast: Secondary | ICD-10-CM

## 2012-03-16 DIAGNOSIS — Z170 Estrogen receptor positive status [ER+]: Secondary | ICD-10-CM

## 2012-03-16 LAB — CBC WITH DIFFERENTIAL/PLATELET
Basophils Absolute: 0.1 10*3/uL (ref 0.0–0.1)
EOS%: 1 % (ref 0.0–7.0)
HGB: 13.7 g/dL (ref 11.6–15.9)
MCH: 32.6 pg (ref 25.1–34.0)
MCV: 97.1 fL (ref 79.5–101.0)
MONO%: 9.8 % (ref 0.0–14.0)
RDW: 13.6 % (ref 11.2–14.5)

## 2012-03-16 LAB — COMPREHENSIVE METABOLIC PANEL (CC13)
AST: 25 U/L (ref 5–34)
Albumin: 4.6 g/dL (ref 3.5–5.0)
Alkaline Phosphatase: 70 U/L (ref 40–150)
BUN: 12 mg/dL (ref 7.0–26.0)
Creatinine: 0.9 mg/dL (ref 0.6–1.1)
Potassium: 4.4 mEq/L (ref 3.5–5.1)
Total Bilirubin: 1.4 mg/dL — ABNORMAL HIGH (ref 0.20–1.20)

## 2012-03-16 NOTE — Progress Notes (Signed)
Hematology and Oncology Follow Up Visit  Maria Noble [id number] 19-Dec-1945 66 y.o. 03/16/2012 3:25 PM PCPdr R Arlyce Dice  Principle Diagnosis: :  History of T2 N1, ER/PR positive, HER-2 negative breast cancer status post right lumpectomy, sentinel lymph node dissection status post four cycles of q.3 week Taxotere, Cytoxan and radiation therapy, completed 01/28/2009, on Femara.  Interim History:  There have been no intercurrent illness, hospitalizations or medication changes. She's been feeling well. There've been no other illnesses or problems. She's had recent imaging studies. Most recent DEXA study March 2012 was within normal limits this will be repeated 2014. She will have a followup mammogram in March of 2014 as well.  Medications: I have reviewed the patient's current medications.  Allergies:  Allergies  Allergen Reactions  . Penicillins     Past Medical History, Surgical history, Social history, and Family History were reviewed and updated.  Review of Systems: Constitutional:  Negative for fever, chills, night sweats, anorexia, weight loss, pain. Cardiovascular: no chest pain or dyspnea on exertion Respiratory: no cough, shortness of breath, or wheezing Neurological: negative Dermatological: negative ENT: negative Skin Gastrointestinal: negative Genito-Urinary: negative Hematological and Lymphatic: negative Breast: negative Musculoskeletal: negative Remaining ROS negative.  Physical Exam: Blood pressure 130/84, pulse 74, temperature 98.7 F (37.1 C), temperature source Oral, resp. rate 20, height 5' 7.5" (1.715 m), weight 134 lb 14.4 oz (61.19 kg). ECOG: 0 General appearance: alert, cooperative and appears stated age Head: Normocephalic, without obvious abnormality, atraumatic Neck: no adenopathy, no carotid bruit, no JVD, supple, symmetrical, trachea midline and thyroid not enlarged, symmetric, no tenderness/mass/nodules Lymph nodes: Cervical, supraclavicular, and  axillary nodes normal. Cardiac : regular rate and rhythm, no murmurs or gallops Pulmonary:clear to auscultation bilaterally and normal percussion bilaterally Breasts: inspection negative, no nipple discharge or bleeding, no masses or nodularity palpable Abdomen:soft, non-tender; bowel sounds normal; no masses,  no organomegaly Extremities negative Neuro: alert, oriented, normal speech, no focal findings or movement disorder noted  Lab Results: Lab Results  Component Value Date   WBC 5.2 03/16/2012   HGB 13.7 03/16/2012   HCT 40.9 03/16/2012   MCV 97.1 03/16/2012   PLT 209 03/16/2012     Chemistry      Component Value Date/Time   NA 142 03/16/2012 1412   NA 142 09/02/2011 1616   K 4.4 03/16/2012 1412   K 3.8 09/02/2011 1616   CL 106 03/16/2012 1412   CL 104 09/02/2011 1616   CO2 26 03/16/2012 1412   CO2 32 09/02/2011 1616   BUN 12.0 03/16/2012 1412   BUN 9 09/02/2011 1616   CREATININE 0.9 03/16/2012 1412   CREATININE 0.70 09/02/2011 1616      Component Value Date/Time   CALCIUM 10.5* 03/16/2012 1412   CALCIUM 9.6 09/02/2011 1616   ALKPHOS 70 03/16/2012 1412   ALKPHOS 72 09/02/2011 1616   AST 25 03/16/2012 1412   AST 25 09/02/2011 1616   ALT 22 03/16/2012 1412   ALT 19 09/02/2011 1616   BILITOT 1.40* 03/16/2012 1412   BILITOT 0.8 09/02/2011 1616      .pathology. Radiological Studies: chest X-ray n/a Mammogram n/a Bone density n/a  Impression and Plan: Patient is doing, no clinical evidence of recurrence. Performance status is excellent. She'll continue on Femara. I will see her in 6 months time.  More than 50% of the visit was spent in patient-related counselling   Pierce Crane, MD 9/26/20133:25 PM

## 2012-03-16 NOTE — Telephone Encounter (Signed)
Gave patient appointment for 10-06-2012 lab only   Gave patient appointent for 10-13-2012 md appointment

## 2012-03-21 ENCOUNTER — Other Ambulatory Visit: Payer: Self-pay | Admitting: Obstetrics & Gynecology

## 2012-03-21 DIAGNOSIS — R928 Other abnormal and inconclusive findings on diagnostic imaging of breast: Secondary | ICD-10-CM

## 2012-03-28 ENCOUNTER — Ambulatory Visit
Admission: RE | Admit: 2012-03-28 | Discharge: 2012-03-28 | Disposition: A | Discharge status: Home / Self Care | Payer: 59 | Source: Ambulatory Visit | Attending: Obstetrics & Gynecology | Admitting: Obstetrics & Gynecology

## 2012-03-28 DIAGNOSIS — R928 Other abnormal and inconclusive findings on diagnostic imaging of breast: Secondary | ICD-10-CM

## 2012-05-05 ENCOUNTER — Other Ambulatory Visit: Payer: Self-pay | Admitting: *Deleted

## 2012-05-05 DIAGNOSIS — C50919 Malignant neoplasm of unspecified site of unspecified female breast: Secondary | ICD-10-CM

## 2012-05-05 DIAGNOSIS — C801 Malignant (primary) neoplasm, unspecified: Secondary | ICD-10-CM

## 2012-05-05 MED ORDER — LETROZOLE 2.5 MG PO TABS: 2.5000 mg | ORAL_TABLET | Freq: Every day | ORAL | Status: DC

## 2012-08-02 ENCOUNTER — Other Ambulatory Visit: Payer: Self-pay | Admitting: Oncology

## 2012-08-02 DIAGNOSIS — C50919 Malignant neoplasm of unspecified site of unspecified female breast: Secondary | ICD-10-CM

## 2012-08-05 ENCOUNTER — Other Ambulatory Visit: Payer: Self-pay

## 2012-08-14 ENCOUNTER — Telehealth: Payer: Self-pay | Admitting: *Deleted

## 2012-08-14 NOTE — Telephone Encounter (Signed)
Left vm for pt to return call to r/s f/u appt. 

## 2012-08-22 ENCOUNTER — Encounter: Payer: Self-pay | Admitting: *Deleted

## 2012-08-22 NOTE — Progress Notes (Signed)
Left message, Mailed letter, Awaiting pt response.  

## 2012-08-22 NOTE — Progress Notes (Signed)
Awaiting patient response I have cancelled her appts. 

## 2012-08-24 ENCOUNTER — Telehealth: Payer: Self-pay | Admitting: *Deleted

## 2012-08-24 NOTE — Telephone Encounter (Signed)
Left vm to r/s f/u appt. 

## 2012-08-24 NOTE — Telephone Encounter (Signed)
Confirmed appt on 08/31/12 at 1000 with Larina Bras, NP.  Pt will continue care with Dr. Welton Flakes.

## 2012-08-31 ENCOUNTER — Encounter: Payer: Self-pay | Admitting: Family

## 2012-08-31 ENCOUNTER — Telehealth: Payer: Self-pay | Admitting: Oncology

## 2012-08-31 ENCOUNTER — Ambulatory Visit (HOSPITAL_BASED_OUTPATIENT_CLINIC_OR_DEPARTMENT_OTHER): Payer: 59 | Admitting: Lab

## 2012-08-31 ENCOUNTER — Ambulatory Visit (HOSPITAL_BASED_OUTPATIENT_CLINIC_OR_DEPARTMENT_OTHER): Payer: 59 | Admitting: Family

## 2012-08-31 VITALS — BP 136/81 | HR 78 | Temp 99.2°F | Resp 20 | Ht 67.5 in | Wt 138.2 lb

## 2012-08-31 DIAGNOSIS — C50919 Malignant neoplasm of unspecified site of unspecified female breast: Secondary | ICD-10-CM

## 2012-08-31 DIAGNOSIS — C50419 Malignant neoplasm of upper-outer quadrant of unspecified female breast: Secondary | ICD-10-CM

## 2012-08-31 DIAGNOSIS — C50911 Malignant neoplasm of unspecified site of right female breast: Secondary | ICD-10-CM

## 2012-08-31 DIAGNOSIS — C50411 Malignant neoplasm of upper-outer quadrant of right female breast: Secondary | ICD-10-CM | POA: Insufficient documentation

## 2012-08-31 LAB — CBC WITH DIFFERENTIAL/PLATELET
Basophils Absolute: 0.1 10*3/uL (ref 0.0–0.1)
EOS%: 0.7 % (ref 0.0–7.0)
HCT: 41.3 % (ref 34.8–46.6)
HGB: 14.1 g/dL (ref 11.6–15.9)
LYMPH%: 25.3 % (ref 14.0–49.7)
MCH: 32.8 pg (ref 25.1–34.0)
MCHC: 34.2 g/dL (ref 31.5–36.0)
NEUT%: 64.2 % (ref 38.4–76.8)
Platelets: 215 10*3/uL (ref 145–400)
lymph#: 1.4 10*3/uL (ref 0.9–3.3)

## 2012-08-31 LAB — COMPREHENSIVE METABOLIC PANEL (CC13)
Albumin: 4.5 g/dL (ref 3.5–5.0)
Alkaline Phosphatase: 83 U/L (ref 40–150)
BUN: 12.5 mg/dL (ref 7.0–26.0)
Calcium: 10.2 mg/dL (ref 8.4–10.4)
Chloride: 106 mEq/L (ref 98–107)
Creatinine: 0.8 mg/dL (ref 0.6–1.1)
Glucose: 105 mg/dl — ABNORMAL HIGH (ref 70–99)
Potassium: 3.8 mEq/L (ref 3.5–5.1)

## 2012-08-31 MED ORDER — LETROZOLE 2.5 MG PO TABS: 2.5000 mg | ORAL_TABLET | Freq: Every day | ORAL | Status: DC

## 2012-08-31 NOTE — Progress Notes (Signed)
[facility]  Telephone:(336) [phone] Fax:(336) [phone]  OFFICE PROGRESS NOTE  PATIENT: Maria Noble   DOB: 11-06-1945  MR#: [id number]  JYN#:[id number]   CC:No primary provider on file. Harriette Bouillon, MD Maryln Gottron, MD Caralyn Guile. Arlyce Dice, MD   DIAGNOSIS:  A 67 year old woman with invasive lobular carcinoma of the right breast, diagnosed in 07/2008.   PRIOR THERAPY: 1. A mammogram on 07/24/2008 showed a suspicious right breast mass at the 10 o'clock  position. A subsequent right breast ultrasound on the same day showed an angular, irregular hypoechoic shadowing mass lesion with surrounding increased echogenicity in the right breast at the 10 o'clock position 6 cm from the nipple measuring 1.7 x 1.4 x 1.3 cm. This corresponded to the patient's question palpable finding. Pathology revealed a right breast invasive lobular carcinoma ER 96%, PR 51%, Ki-67 8%, HER2/neu no amplification.  2. Status post lumpectomy on 08/07/08 with sentinel node biopsy which shows 1/6 positive lymph nodes.  Surgical margins were clear.  Vascular invasion was seen. A total of six sentinel lymph nodes were identified one of which did in fact have metastatic lobular cancer with focal extra nodal spread. Margins were clear.  Stage II, T2 N1a, invasive lobular carcinoma, grade 1.  3. Status post chemotherapy with Q3Week TC x 4 cycles from 09/16/2008 through 11/19/2008.  4. Status post radiation therapy from 12/17/2008 through 01/28/2009.  5. Antiestrogen therapy with Femara since 01/2009.  CURRENT THERAPY:  Annual mammography and Femara 2.5 mg PO daily.   INTERVAL HISTORY: Dr. Welton Flakes and I saw Maria Noble today for follow-up of right breast invasive lobular carcinoma.  She was last seen by Dr. Donnie Coffin on 03/16/2012.  Since her last office visit the patient states that she has been doing well and denies any symptomatology.  The patient remains physically active and states that she writes her  bicycle regularly, weather permitting.   PAST MEDICAL HISTORY: Past Medical History  Diagnosis Date  . Breast cancer   . Glaucoma     PAST SURGICAL HISTORY: Past Surgical History  Procedure Laterality Date  . Breast lumpectomy Right 08/07/2008  . Tonsillectomy    . Hand surgery  1953  . Tubal ligation       FAMILY HISTORY: Family History  Problem Relation Age of Onset  . Dementia Mother   . Heart Problems Father   . Heart attack Brother   . Cancer Brother     SOCIAL HISTORY: History  Substance Use Topics  . Smoking status: Former Smoker    Types: Cigarettes  . Smokeless tobacco: Never Used  . Alcohol Use: 4.2 oz/week    7 Glasses of wine per week    ALLERGIES: Allergies  Allergen Reactions  . Penicillins      MEDICATIONS:  Current Outpatient Prescriptions  Medication Sig Dispense Refill  . aspirin 81 MG tablet Take 81 mg by mouth daily.      Marland Kitchen atorvastatin (LIPITOR) 20 MG tablet Take 20 mg by mouth daily.      . calcium carbonate (OS-CAL) 600 MG TABS Take 600 mg by mouth 2 (two) times daily with a meal.      . Calcium-Vitamin D-Vitamin K (VIACTIV PO) Take 1 tablet by mouth 2 (two) times daily.      . cholecalciferol (VITAMIN D) 1000 UNITS tablet Take 1,000 Units by mouth daily.      Marland Kitchen latanoprost (XALATAN) 0.005 % ophthalmic solution       . letrozole [facility]) 2.5  MG tablet Take 1 tablet (2.5 mg total) by mouth daily.  90 tablet  6  . Multiple Vitamins-Minerals (WOMENS ONE DAILY) TABS Take 1 tablet by mouth daily.       No current facility-administered medications for this visit.      REVIEW OF SYSTEMS: A 10 point review of systems was completed and is negative except.    PHYSICAL EXAMINATION: BP 136/81  Pulse 78  Temp(Src) 99.2 F (37.3 C) (Oral)  Resp 20  Ht 5' 7.5" (1.715 m)  Wt 138 lb 3.2 oz (62.687 kg)  BMI 21.31 kg/m2   General appearance: Alert, cooperative, well nourished, no apparent distress Head: Normocephalic, without obvious  abnormality, atraumatic Eyes: Arcus senilis, PERRLA, EOMI Nose: Nares, septum and mucosa are normal, no drainage or sinus tenderness Neck: No adenopathy, supple, symmetrical, trachea midline, thyroid not enlarged, no tenderness Resp: Clear to auscultation bilaterally Cardio: Regular rate and rhythm, S1, S2 normal, no murmur, click, rub or gallop Breasts: Right breast well-healed surgical scar, bilateral inner quadrant firm mammary ridge, no lymphadenopathy, right breast retracted nipple, no axilla fullness, radiation changes noted  GI: Soft, distended, non-tender, hypoactive bowel sounds, no organomegaly Extremities: Extremities normal, atraumatic, no cyanosis or edema Lymph nodes: Cervical, supraclavicular, and axillary nodes normal Neurologic: Grossly normal    ECOG FS:  Grade 0 - Fully active           LAB RESULTS: Lab Results  Component Value Date   WBC 5.4 08/31/2012   NEUTROABS 3.4 08/31/2012   HGB 14.1 08/31/2012   HCT 41.3 08/31/2012   MCV 96.0 08/31/2012   PLT 215 08/31/2012      Chemistry      Component Value Date/Time   NA 143 08/31/2012 1210   NA 142 09/02/2011 1616   K 3.8 08/31/2012 1210   K 3.8 09/02/2011 1616   CL 106 08/31/2012 1210   CL 104 09/02/2011 1616   CO2 25 08/31/2012 1210   CO2 32 09/02/2011 1616   BUN 12.5 08/31/2012 1210   BUN 9 09/02/2011 1616   CREATININE 0.8 08/31/2012 1210   CREATININE 0.70 09/02/2011 1616      Component Value Date/Time   CALCIUM 10.2 08/31/2012 1210   CALCIUM 9.6 09/02/2011 1616   ALKPHOS 83 08/31/2012 1210   ALKPHOS 72 09/02/2011 1616   AST 26 08/31/2012 1210   AST 25 09/02/2011 1616   ALT 24 08/31/2012 1210   ALT 19 09/02/2011 1616   BILITOT 1.56* 08/31/2012 1210   BILITOT 0.8 09/02/2011 1616       Lab Results  Component Value Date   LABCA2 10 02/12/2011    RADIOGRAPHIC STUDIES: No results found.  ASSESSMENT: 67 y.o. [place], [place] woman with:  [age] year old woman with stage II, T2N1a, invasive lobular carcinoma of  the right breast, with 1/6 positive lymph nodes, ER 96%, PR 51%, Ki- 67 8%, HER2/neu no amplification, grade 1. Status post lumpectomy with sentinel node biopsy on 08/07/2008, status post four cycles of chemotherapy with TC that was completed on 11/19/2008, status post radiation therapy completed on 01/28/2009, she has been on anti-estrogen therapy with Femara since 01/2009.  2. Her last bone density scan on 09/03/2010 showed a T score of -0.6 (normal).  3.  Her last mammogram on 09/08/2011 showed no evidence of malignancy.  PLAN: 1. The patient will continue on anti-estrogen therapy with Femara for approximately another 1.5 years (until 01/2014).  2.We will schedule the patient for a bone density scan in the near  future.  3. We will schedule the patient for digital diagnostic bilateral mammogram in the near future.  4.  We plan to see the patient again in six months at which time we will check a CBC and CMP.  The patient's vitamin D level drawn today is pending at this time.  All questions were answered.  The patient was encouraged to contact us in the interim with any problems, questions or concerns.    Larina Bras, NP-C 09/02/2012, 4:33 PM

## 2012-08-31 NOTE — Patient Instructions (Addendum)
Please contact us at [phone] if you have any questions or concerns. 

## 2012-08-31 NOTE — Telephone Encounter (Signed)
Pt sent back to lb and given appt schedule for September. Pt also given appts for mammo/bone desity 4/2.

## 2012-09-04 ENCOUNTER — Telehealth: Payer: Self-pay | Admitting: Family

## 2012-09-04 DIAGNOSIS — R17 Unspecified jaundice: Secondary | ICD-10-CM

## 2012-09-04 NOTE — Telephone Encounter (Signed)
The patient's bilirubin is elevated - I want to schedule an abd [place] for her + a fractionated bilirubin lab.  Left message for her to call me back.

## 2012-09-05 ENCOUNTER — Telehealth: Payer: Self-pay | Admitting: *Deleted

## 2012-09-05 ENCOUNTER — Telehealth: Payer: Self-pay | Admitting: Family

## 2012-09-05 NOTE — Telephone Encounter (Signed)
Called patient at her home and mobile phone numbers and left a message to give me a call back regarding her laboratory values.  The patient has an elevated bilirubin.  I would like her to have an [place] of her abdomen (complete) and get a fractionated bilirubin (lab).

## 2012-09-05 NOTE — Telephone Encounter (Signed)
Message left by pt giving her name and return call number only.  This RN returned call and obtianed VM. Message left to return call again - see note placed by Norina Buzzard per nature of call.

## 2012-09-05 NOTE — Telephone Encounter (Signed)
This RN was able to speak with pt per need for further evaluation of elevated bilirubin. Discussed with pt recommendations per Annice Pih NP.  Pt verbalized understanding of U/S and lab. U/S has been scheduled for am 3/19 and pt will come to this office post for labs.  Pt also understands to call this office for results on Thursday.

## 2012-09-05 NOTE — Telephone Encounter (Signed)
No entry 

## 2012-09-06 ENCOUNTER — Other Ambulatory Visit: Payer: Self-pay | Admitting: Emergency Medicine

## 2012-09-06 ENCOUNTER — Ambulatory Visit (HOSPITAL_COMMUNITY)
Admission: RE | Admit: 2012-09-06 | Discharge: 2012-09-06 | Disposition: A | Discharge status: Home / Self Care | Payer: 59 | Source: Ambulatory Visit | Attending: Family | Admitting: Family

## 2012-09-06 ENCOUNTER — Other Ambulatory Visit: Payer: 59 | Admitting: Lab

## 2012-09-06 DIAGNOSIS — R17 Unspecified jaundice: Secondary | ICD-10-CM | POA: Insufficient documentation

## 2012-09-06 DIAGNOSIS — C50919 Malignant neoplasm of unspecified site of unspecified female breast: Secondary | ICD-10-CM | POA: Insufficient documentation

## 2012-09-06 LAB — BILIRUBIN, TOTAL: Total Bilirubin: 1.5 mg/dL — ABNORMAL HIGH (ref 0.3–1.2)

## 2012-09-06 LAB — BILIRUBIN,DIRECT & INDIRECT (FRACTIONATED): Indirect Bilirubin: 1.2 mg/dL — ABNORMAL HIGH (ref 0.0–0.9)

## 2012-09-08 ENCOUNTER — Telehealth: Payer: Self-pay | Admitting: Family

## 2012-09-08 NOTE — Telephone Encounter (Signed)
Spoke to the patient about her abdominal US results (normal) and her fractionated bilirubin results (slightly elevated).  Let her know that I also spoke to [place] at her PCP's office (Dr. Azucena Cecil) and let them know of her results as well. We will continue to watch her bilirubin level.  Of note, the patient is taking Atorvastatin.

## 2012-09-20 ENCOUNTER — Ambulatory Visit
Admission: RE | Admit: 2012-09-20 | Discharge: 2012-09-20 | Disposition: A | Discharge status: Home / Self Care | Payer: 59 | Source: Ambulatory Visit | Attending: Family | Admitting: Family

## 2012-09-20 ENCOUNTER — Other Ambulatory Visit: Payer: Self-pay | Admitting: Nurse Practitioner

## 2012-09-20 DIAGNOSIS — C50919 Malignant neoplasm of unspecified site of unspecified female breast: Secondary | ICD-10-CM

## 2012-09-20 DIAGNOSIS — C50911 Malignant neoplasm of unspecified site of right female breast: Secondary | ICD-10-CM

## 2012-09-21 ENCOUNTER — Telehealth: Payer: Self-pay

## 2012-09-21 NOTE — Telephone Encounter (Signed)
Spoke with pt regarding results of bone density scan and mammogram. Per [facility], both are normal. Continue taking Vit D and Calcium. Pt voiced understanding and knows to call the office with any further questions. TMB

## 2012-10-06 ENCOUNTER — Other Ambulatory Visit: Payer: 59 | Admitting: Lab

## 2012-10-13 ENCOUNTER — Ambulatory Visit: Payer: 59 | Admitting: Oncology

## 2012-12-26 ENCOUNTER — Ambulatory Visit: Payer: 59

## 2012-12-26 ENCOUNTER — Ambulatory Visit (INDEPENDENT_AMBULATORY_CARE_PROVIDER_SITE_OTHER): Payer: 59 | Admitting: Family Medicine

## 2012-12-26 DIAGNOSIS — S93409A Sprain of unspecified ligament of unspecified ankle, initial encounter: Secondary | ICD-10-CM

## 2012-12-26 DIAGNOSIS — M25579 Pain in unspecified ankle and joints of unspecified foot: Secondary | ICD-10-CM

## 2012-12-26 DIAGNOSIS — M25571 Pain in right ankle and joints of right foot: Secondary | ICD-10-CM

## 2012-12-26 NOTE — Progress Notes (Signed)
  Subjective:    Patient ID: Maria Noble, female    DOB: 01-Sep-1945, 67 y.o.   MRN: [id number]  HPI   Maria Noble is a very pleasant 67 yr old female here with concern for right ankle injury.  Pt states she suffered a fall last night - not sure how she fell or how she landed.  Did not have pain in the ankle at the time of injury.  Was able to bear weight normally.  Woke up this AM with swollen right ankle and pain with weight bearing.  She has never injured this ankle before.  Denies numbness or tingling.  No pain into the foot or higher into the leg.  Has not tried any home remedy yet.  Otherwise feels well today.    Review of Systems  Constitutional: Negative.   HENT: Negative.   Respiratory: Negative.   Cardiovascular: Negative.   Gastrointestinal: Negative.   Musculoskeletal: Positive for joint swelling and arthralgias.  Skin: Negative for color change.  Neurological: Negative for weakness and numbness.       Objective:   Physical Exam  Vitals reviewed. Constitutional: She is oriented to person, place, and time. She appears well-developed and well-nourished. No distress.  HENT:  Head: Normocephalic and atraumatic.  Eyes: Conjunctivae are normal. No scleral icterus.  Pulmonary/Chest: Effort normal.  Musculoskeletal:       Right ankle: She exhibits decreased range of motion and swelling. She exhibits no ecchymosis, no deformity and normal pulse. Tenderness. Lateral malleolus, AITFL and CF ligament tenderness found. No medial malleolus, no head of 5th metatarsal and no proximal fibula tenderness found. Achilles tendon normal.       Left ankle: Normal.       Right lower leg: Normal.       Right foot: Normal.  Decreased ROM with dorsiflexion and inversion  Neurological: She is alert and oriented to person, place, and time. No sensory deficit.  Skin: Skin is warm and dry.  Psychiatric: She has a normal mood and affect. Her behavior is normal.    [facility] reading (PRIMARY) by  Dr. Conley Rolls  - no fracture noted; ? Lesion at distal tibia though pt has no tenderness there      Assessment & Plan:  Ankle sprain and strain, right, initial encounter  Pain in joint, ankle and foot, right - Plan: DG Ankle Complete Right   Maria Noble is a very pleasant 67 yr old female with right ankle sprain.  X-ray is negative for fracture.  Pt placed in sweedo brace for the next 7 days.  Ice and elevate as much as possible over the next 48 hours.  Ibuprofen 600mg  q8h with food to reduce pain and inflammation.  Gentle ROM exercises in 2-3 days, progress to strengthening and balance exercises over the next 1-2 weeks.  No need for further follow up unless worsening or not improving.

## 2012-12-26 NOTE — Patient Instructions (Addendum)
For the next 24-48 hours, ice and elevated the ankle as much as possible.  Wear the ankle brace for extra support for the next week (can take off while sleeping).  Ibuprofen 600mg  every 8 hours with food to help with both pain and inflammation.  In 2-3 days, gentle ankle circles/pumps and stretches.  Gradually progress to strengthening and balance exercises.  Please let us know if any symptoms are worsening or not improving, or if new concerns arise.   Ankle Sprain An ankle sprain is an injury to the strong, fibrous tissues (ligaments) that hold the bones of your ankle joint together.  CAUSES An ankle sprain is usually caused by a fall or by twisting your ankle. Ankle sprains most commonly occur when you step on the outer edge of your foot, and your ankle turns inward. People who participate in sports are more prone to these types of injuries.  SYMPTOMS   Pain in your ankle. The pain may be present at rest or only when you are trying to stand or walk.  Swelling.  Bruising. Bruising may develop immediately or within 1 to 2 days after your injury.  Difficulty standing or walking, particularly when turning corners or changing directions. DIAGNOSIS  Your caregiver will ask you details about your injury and perform a physical exam of your ankle to determine if you have an ankle sprain. During the physical exam, your caregiver will press on and apply pressure to specific areas of your foot and ankle. Your caregiver will try to move your ankle in certain ways. An X-ray exam may be done to be sure a bone was not broken or a ligament did not separate from one of the bones in your ankle (avulsion fracture).  TREATMENT  Certain types of braces can help stabilize your ankle. Your caregiver can make a recommendation for this. Your caregiver may recommend the use of medicine for pain. If your sprain is severe, your caregiver may refer you to a surgeon who helps to restore function to parts of your skeletal  system (orthopedist) or a physical therapist. HOME CARE INSTRUCTIONS   Apply ice to your injury for 1 2 days or as directed by your caregiver. Applying ice helps to reduce inflammation and pain.  Put ice in a plastic bag.  Place a towel between your skin and the bag.  Leave the ice on for 15-20 minutes at a time, every 2 hours while you are awake.  Only take over-the-counter or prescription medicines for pain, discomfort, or fever as directed by your caregiver.  Elevate your injured ankle above the level of your heart as much as possible for 2 3 days.  If your caregiver recommends crutches, use them as instructed. Gradually put weight on the affected ankle. Continue to use crutches or a cane until you can walk without feeling pain in your ankle.  If you have a plaster splint, wear the splint as directed by your caregiver. Do not rest it on anything harder than a pillow for the first 24 hours. Do not put weight on it. Do not get it wet. You may take it off to take a shower or bath.  You may have been given an elastic bandage to wear around your ankle to provide support. If the elastic bandage is too tight (you have numbness or tingling in your foot or your foot becomes cold and blue), adjust the bandage to make it comfortable.  If you have an air splint, you may blow more air  into it or let air out to make it more comfortable. You may take your splint off at night and before taking a shower or bath. Wiggle your toes in the splint several times per day to decrease swelling. SEEK MEDICAL CARE IF:   You have rapidly increasing bruising or swelling.  Your toes feel extremely cold or you lose feeling in your foot.  Your pain is not relieved with medicine. SEEK IMMEDIATE MEDICAL CARE IF:  Your toes are numb or blue.  You have severe pain that is increasing. MAKE SURE YOU:   Understand these instructions.  Will watch your condition.  Will get help right away if you are not doing well  or get worse. Document Released: 06/07/2005 Document Revised: 03/01/2012 Document Reviewed: 06/19/2011 [facility] Patient Information 2014 [place], [place].

## 2013-01-24 ENCOUNTER — Other Ambulatory Visit: Payer: Self-pay

## 2013-03-05 ENCOUNTER — Other Ambulatory Visit (HOSPITAL_BASED_OUTPATIENT_CLINIC_OR_DEPARTMENT_OTHER): Payer: Medicare Other

## 2013-03-05 ENCOUNTER — Ambulatory Visit (HOSPITAL_BASED_OUTPATIENT_CLINIC_OR_DEPARTMENT_OTHER): Payer: Medicare Other | Admitting: Oncology

## 2013-03-05 ENCOUNTER — Telehealth: Payer: Self-pay | Admitting: Oncology

## 2013-03-05 VITALS — BP 138/85 | HR 72 | Temp 98.7°F | Resp 20 | Ht 67.5 in | Wt 139.9 lb

## 2013-03-05 DIAGNOSIS — C50919 Malignant neoplasm of unspecified site of unspecified female breast: Secondary | ICD-10-CM

## 2013-03-05 DIAGNOSIS — C50419 Malignant neoplasm of upper-outer quadrant of unspecified female breast: Secondary | ICD-10-CM

## 2013-03-05 DIAGNOSIS — C50911 Malignant neoplasm of unspecified site of right female breast: Secondary | ICD-10-CM

## 2013-03-05 LAB — CBC WITH DIFFERENTIAL/PLATELET
BASO%: 1.1 % (ref 0.0–2.0)
EOS%: 1.3 % (ref 0.0–7.0)
MCH: 32.7 pg (ref 25.1–34.0)
MCHC: 34.3 g/dL (ref 31.5–36.0)
MONO#: 0.4 10*3/uL (ref 0.1–0.9)
RDW: 13.1 % (ref 11.2–14.5)
WBC: 4.9 10*3/uL (ref 3.9–10.3)
lymph#: 1.3 10*3/uL (ref 0.9–3.3)

## 2013-03-05 LAB — COMPREHENSIVE METABOLIC PANEL (CC13)
ALT: 23 U/L (ref 0–55)
AST: 26 U/L (ref 5–34)
Albumin: 4.3 g/dL (ref 3.5–5.0)
Calcium: 10.6 mg/dL — ABNORMAL HIGH (ref 8.4–10.4)
Chloride: 107 mEq/L (ref 98–109)
Creatinine: 0.8 mg/dL (ref 0.6–1.1)
Potassium: 4.4 mEq/L (ref 3.5–5.1)
Sodium: 143 mEq/L (ref 136–145)
Total Protein: 7.2 g/dL (ref 6.4–8.3)

## 2013-03-05 MED ORDER — LETROZOLE 2.5 MG PO TABS: 2.5000 mg | ORAL_TABLET | Freq: Every day | ORAL | Status: DC

## 2013-03-05 NOTE — Telephone Encounter (Signed)
, °

## 2013-03-05 NOTE — Patient Instructions (Addendum)
Continue letrozole 2.5 mg daily  I will see you back in 6 months

## 2013-03-08 ENCOUNTER — Other Ambulatory Visit: Payer: Self-pay | Admitting: Emergency Medicine

## 2013-03-08 DIAGNOSIS — C50919 Malignant neoplasm of unspecified site of unspecified female breast: Secondary | ICD-10-CM

## 2013-03-08 DIAGNOSIS — C50911 Malignant neoplasm of unspecified site of right female breast: Secondary | ICD-10-CM

## 2013-03-08 MED ORDER — LETROZOLE 2.5 MG PO TABS: 2.5000 mg | ORAL_TABLET | Freq: Every day | ORAL | Status: DC

## 2013-03-11 NOTE — Progress Notes (Signed)
[facility]  Telephone:(336) [phone] Fax:(336) [phone]  OFFICE PROGRESS NOTE  PATIENT: Maria Noble   DOB: 1946-06-19  MR#: [id number]  JYN#:[id number]   QM:VHQION,GEXBM W, MD Harriette Bouillon, MD Maryln Gottron, MD Caralyn Guile. Arlyce Dice, MD   DIAGNOSIS:  A 67 year old woman with invasive lobular carcinoma of the right breast, diagnosed in 07/2008. Prior patient of Dr. Pierce Crane  PRIOR THERAPY: 1. A mammogram on 07/24/2008 showed a suspicious right breast mass at the 10 o'clock  position. A subsequent right breast ultrasound on the same day showed an angular, irregular hypoechoic shadowing mass lesion with surrounding increased echogenicity in the right breast at the 10 o'clock position 6 cm from the nipple measuring 1.7 x 1.4 x 1.3 cm. This corresponded to the patient's question palpable finding. Pathology revealed a right breast invasive lobular carcinoma ER 96%, PR 51%, Ki-67 8%, HER2/neu no amplification.  2. Status post lumpectomy on 08/07/08 with sentinel node biopsy which shows 1/6 positive lymph nodes.  Surgical margins were clear.  Vascular invasion was seen. A total of six sentinel lymph nodes were identified one of which did in fact have metastatic lobular cancer with focal extra nodal spread. Margins were clear.  Stage II, T2 N1a, invasive lobular carcinoma, grade 1.  3. Status post chemotherapy with Q3Week TC x 4 cycles from 09/16/2008 through 11/19/2008.  4. Status post radiation therapy from 12/17/2008 through 01/28/2009.  5. Antiestrogen therapy with Femara since 01/2009.  CURRENT THERAPY:  Annual mammography and Femara 2.5 mg PO daily.   INTERVAL HISTORY:  Maria Noble is seen today for follow-up of right breast invasive lobular carcinoma.  Since her last office visit the patient states that she has been doing well and denies any symptomatology.  The patient remains physically active and states that she writes her bicycle regularly, weather  permitting.   PAST MEDICAL HISTORY: Past Medical History  Diagnosis Date  . Breast cancer   . Glaucoma     PAST SURGICAL HISTORY: Past Surgical History  Procedure Laterality Date  . Breast lumpectomy Right 08/07/2008  . Tonsillectomy    . Hand surgery  1953  . Tubal ligation       FAMILY HISTORY: Family History  Problem Relation Age of Onset  . Dementia Mother   . Heart Problems Father   . Heart attack Brother   . Cancer Brother     SOCIAL HISTORY: History  Substance Use Topics  . Smoking status: Former Smoker    Types: Cigarettes  . Smokeless tobacco: Never Used  . Alcohol Use: 4.2 oz/week    7 Glasses of wine per week    ALLERGIES: Allergies  Allergen Reactions  . Penicillins      MEDICATIONS:  Current Outpatient Prescriptions  Medication Sig Dispense Refill  . aspirin 81 MG tablet Take 81 mg by mouth daily.      Marland Kitchen atorvastatin (LIPITOR) 20 MG tablet Take 20 mg by mouth daily.      . calcium carbonate (OS-CAL) 600 MG TABS Take 600 mg by mouth 2 (two) times daily with a meal.      . Calcium-Vitamin D-Vitamin K (VIACTIV PO) Take 1 tablet by mouth 2 (two) times daily.      . cholecalciferol (VITAMIN D) 1000 UNITS tablet Take 1,000 Units by mouth daily.      Marland Kitchen latanoprost (XALATAN) 0.005 % ophthalmic solution       . letrozole (FEMARA) 2.5 MG tablet Take 1 tablet (2.5 mg total) by  mouth daily.  90 tablet  6  . Multiple Vitamins-Minerals (WOMENS ONE DAILY) TABS Take 1 tablet by mouth daily.       No current facility-administered medications for this visit.      REVIEW OF SYSTEMS: A 10 point review of systems was completed and is negative except.    PHYSICAL EXAMINATION: BP 138/85  Pulse 72  Temp(Src) 98.7 F (37.1 C) (Oral)  Resp 20  Ht 5' 7.5" (1.715 m)  Wt 139 lb 14.4 oz (63.458 kg)  BMI 21.58 kg/m2   General appearance: Alert, cooperative, well nourished, no apparent distress Head: Normocephalic, without obvious abnormality,  atraumatic Eyes: Arcus senilis, PERRLA, EOMI Nose: Nares, septum and mucosa are normal, no drainage or sinus tenderness Neck: No adenopathy, supple, symmetrical, trachea midline, thyroid not enlarged, no tenderness Resp: Clear to auscultation bilaterally Cardio: Regular rate and rhythm, S1, S2 normal, no murmur, click, rub or gallop Breasts: Right breast well-healed surgical scar, bilateral inner quadrant firm mammary ridge, no lymphadenopathy, right breast retracted nipple, no axilla fullness, radiation changes noted  GI: Soft, distended, non-tender, hypoactive bowel sounds, no organomegaly Extremities: Extremities normal, atraumatic, no cyanosis or edema Lymph nodes: Cervical, supraclavicular, and axillary nodes normal Neurologic: Grossly normal    ECOG FS:  Grade 0 - Fully active           LAB RESULTS: Lab Results  Component Value Date   WBC 4.9 03/05/2013   NEUTROABS 3.2 03/05/2013   HGB 14.1 03/05/2013   HCT 41.0 03/05/2013   MCV 95.4 03/05/2013   PLT 207 03/05/2013      Chemistry      Component Value Date/Time   NA 143 03/05/2013 0929   NA 142 09/02/2011 1616   K 4.4 03/05/2013 0929   K 3.8 09/02/2011 1616   CL 106 08/31/2012 1210   CL 104 09/02/2011 1616   CO2 27 03/05/2013 0929   CO2 32 09/02/2011 1616   BUN 13.2 03/05/2013 0929   BUN 9 09/02/2011 1616   CREATININE 0.8 03/05/2013 0929   CREATININE 0.70 09/02/2011 1616      Component Value Date/Time   CALCIUM 10.6* 03/05/2013 0929   CALCIUM 9.6 09/02/2011 1616   ALKPHOS 76 03/05/2013 0929   ALKPHOS 72 09/02/2011 1616   AST 26 03/05/2013 0929   AST 25 09/02/2011 1616   ALT 23 03/05/2013 0929   ALT 19 09/02/2011 1616   BILITOT 1.47* 03/05/2013 0929   BILITOT 1.5* 09/06/2012 0853       Lab Results  Component Value Date   LABCA2 10 02/12/2011    RADIOGRAPHIC STUDIES: No results found.  ASSESSMENT: 67 y.o. [place], [place] woman with:  67 year old woman with stage II, T2N1a, invasive lobular carcinoma of the right  breast, with 1/6 positive lymph nodes, ER 96%, PR 51%, Ki- 67 8%, HER2/neu no amplification, grade 1. Status post lumpectomy with sentinel node biopsy on 08/07/2008, status post four cycles of chemotherapy with TC that was completed on 11/19/2008, status post radiation therapy completed on 01/28/2009, she has been on anti-estrogen therapy with Femara since 01/2009.  2. Her last bone density scan on 09/03/2010 showed a T score of -0.6 (normal).  3.  Her last mammogram on 09/08/2011 showed no evidence of malignancy.  PLAN: 1. The patient will continue on anti-estrogen therapy with Femara for approximately another 1.5 years (until 01/2014).  2.We will schedule the patient for a bone density scan in the near future.  3. We will schedule the patient for  digital diagnostic bilateral mammogram in the near future.  4.  We plan to see the patient again in six months at which time we will check a CBC and CMP.  The patient's vitamin D level drawn today is pending at this time.  All questions were answered.  The patient was encouraged to contact us in the interim with any problems, questions or concerns.   Drue Second, MD Medical/Oncology [facility] [phone] (beeper) [phone] (Office)

## 2013-03-20 ENCOUNTER — Other Ambulatory Visit: Payer: Self-pay

## 2013-03-27 ENCOUNTER — Other Ambulatory Visit: Payer: Self-pay | Admitting: Obstetrics & Gynecology

## 2013-04-25 ENCOUNTER — Other Ambulatory Visit: Payer: Self-pay | Admitting: Oncology

## 2013-04-25 NOTE — Telephone Encounter (Signed)
Script was refilled on 03/08/13 electronically and this RN called pharmacy today and confirmed it was received and is being filled. This is 2nd electronic refill requested today and it is not needed-patient is calling in refill on her old bottle.

## 2013-04-26 ENCOUNTER — Other Ambulatory Visit: Payer: Self-pay

## 2013-06-25 ENCOUNTER — Other Ambulatory Visit: Payer: Self-pay | Admitting: Emergency Medicine

## 2013-06-25 MED ORDER — LETROZOLE 2.5 MG PO TABS: 2.5000 mg | ORAL_TABLET | Freq: Every day | ORAL | Status: DC

## 2013-07-03 DIAGNOSIS — E782 Mixed hyperlipidemia: Secondary | ICD-10-CM | POA: Diagnosis not present

## 2013-07-03 DIAGNOSIS — C50919 Malignant neoplasm of unspecified site of unspecified female breast: Secondary | ICD-10-CM | POA: Diagnosis not present

## 2013-07-03 DIAGNOSIS — Z1331 Encounter for screening for depression: Secondary | ICD-10-CM | POA: Diagnosis not present

## 2013-08-23 DIAGNOSIS — H4011X0 Primary open-angle glaucoma, stage unspecified: Secondary | ICD-10-CM | POA: Diagnosis not present

## 2013-08-23 DIAGNOSIS — H409 Unspecified glaucoma: Secondary | ICD-10-CM | POA: Diagnosis not present

## 2013-09-03 ENCOUNTER — Other Ambulatory Visit (HOSPITAL_BASED_OUTPATIENT_CLINIC_OR_DEPARTMENT_OTHER): Payer: Medicare Other

## 2013-09-03 ENCOUNTER — Telehealth: Payer: Self-pay | Admitting: *Deleted

## 2013-09-03 ENCOUNTER — Ambulatory Visit (HOSPITAL_BASED_OUTPATIENT_CLINIC_OR_DEPARTMENT_OTHER): Payer: Medicare Other | Admitting: Oncology

## 2013-09-03 ENCOUNTER — Encounter: Payer: Self-pay | Admitting: Oncology

## 2013-09-03 VITALS — BP 134/76 | HR 71 | Temp 98.0°F | Resp 18 | Ht 67.5 in | Wt 145.9 lb

## 2013-09-03 DIAGNOSIS — Z170 Estrogen receptor positive status [ER+]: Secondary | ICD-10-CM | POA: Diagnosis not present

## 2013-09-03 DIAGNOSIS — C50919 Malignant neoplasm of unspecified site of unspecified female breast: Secondary | ICD-10-CM

## 2013-09-03 DIAGNOSIS — C50419 Malignant neoplasm of upper-outer quadrant of unspecified female breast: Secondary | ICD-10-CM

## 2013-09-03 DIAGNOSIS — C773 Secondary and unspecified malignant neoplasm of axilla and upper limb lymph nodes: Secondary | ICD-10-CM

## 2013-09-03 DIAGNOSIS — C50911 Malignant neoplasm of unspecified site of right female breast: Secondary | ICD-10-CM

## 2013-09-03 LAB — CBC WITH DIFFERENTIAL/PLATELET
BASO%: 1 % (ref 0.0–2.0)
BASOS ABS: 0.1 10*3/uL (ref 0.0–0.1)
EOS ABS: 0.1 10*3/uL (ref 0.0–0.5)
EOS%: 1.1 % (ref 0.0–7.0)
HCT: 41.4 % (ref 34.8–46.6)
HEMOGLOBIN: 13.8 g/dL (ref 11.6–15.9)
LYMPH%: 20.7 % (ref 14.0–49.7)
MCH: 31.9 pg (ref 25.1–34.0)
MCHC: 33.4 g/dL (ref 31.5–36.0)
MCV: 95.3 fL (ref 79.5–101.0)
MONO#: 0.4 10*3/uL (ref 0.1–0.9)
MONO%: 7.9 % (ref 0.0–14.0)
NEUT%: 69.3 % (ref 38.4–76.8)
NEUTROS ABS: 3.7 10*3/uL (ref 1.5–6.5)
PLATELETS: 217 10*3/uL (ref 145–400)
RBC: 4.34 10*6/uL (ref 3.70–5.45)
RDW: 12.9 % (ref 11.2–14.5)
WBC: 5.3 10*3/uL (ref 3.9–10.3)
lymph#: 1.1 10*3/uL (ref 0.9–3.3)

## 2013-09-03 LAB — COMPREHENSIVE METABOLIC PANEL (CC13)
ALBUMIN: 4.4 g/dL (ref 3.5–5.0)
ALK PHOS: 86 U/L (ref 40–150)
ALT: 22 U/L (ref 0–55)
ANION GAP: 12 meq/L — AB (ref 3–11)
AST: 23 U/L (ref 5–34)
BUN: 8 mg/dL (ref 7.0–26.0)
CO2: 23 meq/L (ref 22–29)
Calcium: 9.9 mg/dL (ref 8.4–10.4)
Chloride: 106 mEq/L (ref 98–109)
Creatinine: 0.8 mg/dL (ref 0.6–1.1)
GLUCOSE: 103 mg/dL (ref 70–140)
POTASSIUM: 3.9 meq/L (ref 3.5–5.1)
SODIUM: 141 meq/L (ref 136–145)
TOTAL PROTEIN: 6.8 g/dL (ref 6.4–8.3)
Total Bilirubin: 1.31 mg/dL — ABNORMAL HIGH (ref 0.20–1.20)

## 2013-09-03 NOTE — Progress Notes (Signed)
[facility]  Telephone:([phone]) [phone] Fax:([phone]) [phone]  OFFICE PROGRESS NOTE  PATIENT: Maria Noble   DOB: Oct 24, 1945  MR#: [id number]  OEH#:[id number]   IB:[id number] W, MD Erroll Luna, MD Rexene Edison, MD Janice Coffin. Deatra Ina, MD   DIAGNOSIS:  A 68 year old woman with invasive lobular carcinoma of the right breast, diagnosed in 07/2008. Prior patient of Dr. Eston Esters  PRIOR THERAPY: 1. A mammogram on 07/24/2008 showed a suspicious right breast mass at the 10 o'clock  position. A subsequent right breast ultrasound on the same day showed an angular, irregular hypoechoic shadowing mass lesion with surrounding increased echogenicity in the right breast at the 10 o'clock position 6 cm from the nipple measuring 1.7 x 1.4 x 1.3 cm. This corresponded to the patient's question palpable finding. Pathology revealed a right breast invasive lobular carcinoma ER 96%, PR 51%, Ki-67 8%, HER2/neu no amplification.  2. Status post lumpectomy on 08/07/08 with sentinel node biopsy which shows 1/6 positive lymph nodes.  Surgical margins were clear.  Vascular invasion was seen. A total of six sentinel lymph nodes were identified one of which did in fact have metastatic lobular cancer with focal extra nodal spread. Margins were clear.  Stage II, T2 N1a, invasive lobular carcinoma, grade 1.  3. Status post chemotherapy with Q3Week TC x 4 cycles from 09/16/2008 through 11/19/2008.  4. Status post radiation therapy from 12/17/2008 through 01/28/2009.  5. Antiestrogen therapy with Femara since 01/2009.  CURRENT THERAPY:   Femara 2.5 mg PO daily.   INTERVAL HISTORY: Patient is seen in followup today. Clinically she looks terrific. We went over her pathology again. She has been on letrozole 2.5 mg daily since August 2010. It will be 5 years, August 2015. We discussed the possibility of extending therapy further depending on how she is tolerating it. Which seems to be just fine. We  discussed the rationale for this. She did have a stage II node-positive disease. In overall she is doing well with the medication. I think it seems reasonable to extend it. She otherwise is doing well. Today she denies any headaches double vision blurring of vision fevers chills night sweats. No shortness of breath chest pains palpitations. No abdominal pain no diarrhea or constipation. She has no easy bruising or bleeding. She has no myalgias and arthralgias. No peripheral paresthesias or gait disturbances. Remainder of the 10 point review of systems is negative.     PAST MEDICAL HISTORY: Past Medical History  Diagnosis Date  . Breast cancer   . Glaucoma     PAST SURGICAL HISTORY: Past Surgical History  Procedure Laterality Date  . Breast lumpectomy Right 08/07/2008  . Tonsillectomy    . Hand surgery  1953  . Tubal ligation       FAMILY HISTORY: Family History  Problem Relation Age of Onset  . Dementia Mother   . Heart Problems Father   . Heart attack Brother   . Cancer Brother     SOCIAL HISTORY: History  Substance Use Topics  . Smoking status: Former Smoker    Types: Cigarettes  . Smokeless tobacco: Never Used  . Alcohol Use: 4.2 oz/week    7 Glasses of wine per week    ALLERGIES: Allergies  Allergen Reactions  . Penicillins      MEDICATIONS:  Current Outpatient Prescriptions  Medication Sig Dispense Refill  . aspirin 81 MG tablet Take 81 mg by mouth daily.      Marland Kitchen atorvastatin (LIPITOR) 20 MG tablet Take  20 mg by mouth daily.      . calcium carbonate (OS-CAL) 600 MG TABS Take 600 mg by mouth 2 (two) times daily with a meal.      . Calcium-Vitamin D-Vitamin K (VIACTIV PO) Take 1 tablet by mouth 2 (two) times daily.      . cholecalciferol (VITAMIN D) 1000 UNITS tablet Take 1,000 Units by mouth daily.      Marland Kitchen latanoprost (XALATAN) 0.005 % ophthalmic solution       . letrozole (FEMARA) 2.5 MG tablet Take 1 tablet (2.5 mg total) by mouth daily.  90 tablet  5  .  Multiple Vitamins-Minerals (WOMENS ONE DAILY) TABS Take 1 tablet by mouth daily.       No current facility-administered medications for this visit.      REVIEW OF SYSTEMS: A 10 point review of systems was completed and is negative except.    PHYSICAL EXAMINATION: BP 134/76  Pulse 71  Temp(Src) 98 F (36.7 C) (Oral)  Resp 18  Ht 5' 7.5" (1.715 m)  Wt 145 lb 14.4 oz (66.18 kg)  BMI 22.50 kg/m2   General appearance: Alert, cooperative, well nourished, no apparent distress Head: Normocephalic, without obvious abnormality, atraumatic Eyes: Arcus senilis, PERRLA, EOMI Nose: Nares, septum and mucosa are normal, no drainage or sinus tenderness Neck: No adenopathy, supple, symmetrical, trachea midline, thyroid not enlarged, no tenderness Resp: Clear to auscultation bilaterally Cardio: Regular rate and rhythm, S1, S2 normal, no murmur, click, rub or gallop Breasts: Right breast well-healed surgical scar, bilateral inner quadrant firm mammary ridge, no lymphadenopathy, right breast retracted nipple, no axilla fullness, radiation changes noted  GI: Soft, distended, non-tender, hypoactive bowel sounds, no organomegaly Extremities: Extremities normal, atraumatic, no cyanosis or edema Lymph nodes: Cervical, supraclavicular, and axillary nodes normal Neurologic: Grossly normal    ECOG FS:  Grade 0 - Fully active           LAB RESULTS: Lab Results  Component Value Date   WBC 5.3 09/03/2013   NEUTROABS 3.7 09/03/2013   HGB 13.8 09/03/2013   HCT 41.4 09/03/2013   MCV 95.3 09/03/2013   PLT 217 09/03/2013      Chemistry      Component Value Date/Time   NA 141 09/03/2013 1008   NA 142 09/02/2011 1616   K 3.9 09/03/2013 1008   K 3.8 09/02/2011 1616   CL 106 08/31/2012 1210   CL 104 09/02/2011 1616   CO2 23 09/03/2013 1008   CO2 32 09/02/2011 1616   BUN 8.0 09/03/2013 1008   BUN 9 09/02/2011 1616   CREATININE 0.8 09/03/2013 1008   CREATININE 0.70 09/02/2011 1616      Component Value Date/Time    CALCIUM 9.9 09/03/2013 1008   CALCIUM 9.6 09/02/2011 1616   ALKPHOS 86 09/03/2013 1008   ALKPHOS 72 09/02/2011 1616   AST 23 09/03/2013 1008   AST 25 09/02/2011 1616   ALT 22 09/03/2013 1008   ALT 19 09/02/2011 1616   BILITOT 1.31* 09/03/2013 1008   BILITOT 1.5* 09/06/2012 0853       Lab Results  Component Value Date   LABCA2 10 02/12/2011    RADIOGRAPHIC STUDIES: No results found.  ASSESSMENT: 68 y.o. [place], [place] woman with:  1. stage II, (T2N1a), invasive lobular carcinoma of the right breast, with 1/6 positive lymph nodes, ER 96%, PR 51%, Ki- 67 8%, HER2/neu no amplification, grade 1. Status post lumpectomy with sentinel node biopsy on 08/07/2008, status post four cycles of  chemotherapy with TC that was completed on 11/19/2008, status post radiation therapy completed on 01/28/2009, she has been on anti-estrogen therapy with Femara since 01/2009. She has no clinical evidence of recurrent disease presently. We will continue the letrozole for another 2-5 years depending on how she tolerates it. We certainly will visit the situation periodically. Patient has had bone density scans performed her last bone density was in April 2014. This was normal. She is encouraged to take calcium and vitamin D. She is up-to-date on her mammograms.  2. Patient will be seen back in 6 months time for followup. Prescription for letrozole was sent to her pharmacy  All questions were answered.  The patient was encouraged to contact us in the interim with any problems, questions or concerns.   Marcy Panning, MD Medical/Oncology [facility] [phone] (beeper) [phone] (Office)

## 2013-09-03 NOTE — Telephone Encounter (Signed)
appts made and printed...td 

## 2013-12-24 DIAGNOSIS — H4011X0 Primary open-angle glaucoma, stage unspecified: Secondary | ICD-10-CM | POA: Diagnosis not present

## 2013-12-24 DIAGNOSIS — H409 Unspecified glaucoma: Secondary | ICD-10-CM | POA: Diagnosis not present

## 2014-01-17 DIAGNOSIS — C50919 Malignant neoplasm of unspecified site of unspecified female breast: Secondary | ICD-10-CM | POA: Diagnosis not present

## 2014-01-17 DIAGNOSIS — E782 Mixed hyperlipidemia: Secondary | ICD-10-CM | POA: Diagnosis not present

## 2014-01-17 DIAGNOSIS — R413 Other amnesia: Secondary | ICD-10-CM | POA: Diagnosis not present

## 2014-01-17 DIAGNOSIS — R7309 Other abnormal glucose: Secondary | ICD-10-CM | POA: Diagnosis not present

## 2014-01-21 ENCOUNTER — Other Ambulatory Visit: Payer: Self-pay | Admitting: Family Medicine

## 2014-01-21 DIAGNOSIS — R413 Other amnesia: Secondary | ICD-10-CM

## 2014-01-29 ENCOUNTER — Ambulatory Visit
Admission: RE | Admit: 2014-01-29 | Discharge: 2014-01-29 | Disposition: A | Discharge status: Home / Self Care | Payer: Medicare Other | Source: Ambulatory Visit | Attending: Family Medicine | Admitting: Family Medicine

## 2014-01-29 DIAGNOSIS — R413 Other amnesia: Secondary | ICD-10-CM

## 2014-01-29 MED ORDER — GADOBENATE DIMEGLUMINE 529 MG/ML IV SOLN
12.0000 mL | Freq: Once | INTRAVENOUS | Status: AC | PRN
  Administered 2014-01-29: 12 mL via INTRAVENOUS

## 2014-02-21 ENCOUNTER — Other Ambulatory Visit: Payer: Self-pay | Admitting: *Deleted

## 2014-02-21 DIAGNOSIS — C50919 Malignant neoplasm of unspecified site of unspecified female breast: Secondary | ICD-10-CM

## 2014-02-21 MED ORDER — LETROZOLE 2.5 MG PO TABS: 2.5000 mg | ORAL_TABLET | Freq: Every day | ORAL | Status: DC

## 2014-03-01 ENCOUNTER — Ambulatory Visit (INDEPENDENT_AMBULATORY_CARE_PROVIDER_SITE_OTHER): Payer: Medicare Other | Admitting: Neurology

## 2014-03-01 ENCOUNTER — Encounter: Payer: Self-pay | Admitting: Neurology

## 2014-03-01 VITALS — BP 122/80 | HR 65 | Temp 98.5°F | Ht 68.0 in | Wt 139.0 lb

## 2014-03-01 DIAGNOSIS — R413 Other amnesia: Secondary | ICD-10-CM | POA: Diagnosis not present

## 2014-03-01 NOTE — Patient Instructions (Signed)
1. Continue physical and brain stimulation exercises for brain health 2. Monitor mood 3. Follow-up in 3 months

## 2014-03-01 NOTE — Progress Notes (Signed)
NEUROLOGY CONSULTATION NOTE  Maria Noble MRN: [id number] DOB: 1945/07/22  Referring provider: Dr. Antony Contras Primary care provider: Dr. Antony Contras  Reason for consult:  Memory loss  Dear Dr Moreen Fowler:  Thank you for your kind referral of Maria Noble for consultation of the above symptoms. Although her history is well known to you, please allow me to reiterate it for the purpose of our medical record. The patient was accompanied to the clinic by her husband who also provides collateral information. Records and images were personally reviewed where available.  HISTORY OF PRESENT ILLNESS: This is a pleasant 68 year old right-handed woman with a history of breast cancer s/p chemotherapy and radiation in 2010, hyperlipidemia, presenting for worsening memory loss.  The patient herself does not notice this much, her husband has expressed more concern.  Ms. Rumberger does agree that her memory is not as good as it used to be.  She states she occasionally forgets to take her medication but this is rare. She drives short distances and does not get lost. She continues to write checks and denies any missed bill payments. She is active and rides her bike daily and works in the yard.  Her husband has noticed that over the past 6 months she would ask the same questions about a topic they just talked about. She used to be an avid reader but has stopped doing this because she cannot follow the story.  She has become more socially withdrawn.  He states she has good and bad days and symptoms come in spurts. He denies any other personality changes, no paranoia. No word-finding difficulties. She reports refreshing sleep, seldom snores.  There is a family history of Alzheimer's in her mother diagnosed at age 57.  She denies any headaches, dizziness, diplopia, dysarthria, dysphagia, neck/back pain, focal numbness/tingling/weakness, bowel/bladder dysfunction, tremors.  Laboratory Data 01/17/14: CBC, CMP normal.  TSH 3.88, vitamin B12 453. HbA1c 5.5.  I personally reviewed MRI brain with and without contrast which did not show any acute changes. There is mild diffuse atrophy, mild to moderate bilateral chronic microvascular ischemic changes, and tiny areas of hemosiderin deposition can be seen in on gradient sequence involving the right  frontal, parietal, occipital, and temporal cortex and subcortical white matter.  PAST MEDICAL HISTORY: Past Medical History  Diagnosis Date  . Breast cancer   . Glaucoma     PAST SURGICAL HISTORY: Past Surgical History  Procedure Laterality Date  . Breast lumpectomy Right 08/07/2008  . Tonsillectomy    . Hand surgery  1953  . Tubal ligation      MEDICATIONS: Current Outpatient Prescriptions on File Prior to Visit  Medication Sig Dispense Refill  . ascorbic acid (VITAMIN C) 100 MG tablet Take 100 mg by mouth daily.      . calcium carbonate (OS-CAL) 600 MG TABS Take 600 mg by mouth 2 (two) times daily with a meal.      . cholecalciferol (VITAMIN D) 1000 UNITS tablet Take 1,000 Units by mouth daily.      Marland Kitchen latanoprost (XALATAN) 0.005 % ophthalmic solution       . letrozole (FEMARA) 2.5 MG tablet Take 1 tablet (2.5 mg total) by mouth daily.  90 tablet  0  . Multiple Vitamins-Minerals (WOMENS ONE DAILY) TABS Take 1 tablet by mouth daily.      . timolol (TIMOPTIC) 0.5 % ophthalmic solution        No current facility-administered medications on file prior to visit.  ALLERGIES: Allergies  Allergen Reactions  . Penicillin G   . Penicillins     FAMILY HISTORY: Family History  Problem Relation Age of Onset  . Dementia Mother   . Heart Problems Father   . Heart attack Brother   . Cancer Brother     SOCIAL HISTORY: History   Social History  . Marital Status: Married    Spouse Name: N/A    Number of Children: N/A  . Years of Education: N/A   Occupational History  . Not on file.   Social History Main Topics  . Smoking status: Former Smoker     Types: Cigarettes  . Smokeless tobacco: Never Used  . Alcohol Use: 4.2 oz/week    7 Glasses of wine per week  . Drug Use: No  . Sexual Activity: Yes   Other Topics Concern  . Not on file   Social History Narrative  . No narrative on file    REVIEW OF SYSTEMS: Constitutional: No fevers, chills, or sweats, no generalized fatigue, change in appetite Eyes: No visual changes, double vision, eye pain Ear, nose and throat: No hearing loss, ear pain, nasal congestion, sore throat Cardiovascular: No chest pain, palpitations Respiratory:  No shortness of breath at rest or with exertion, wheezes GastrointestinaI: No nausea, vomiting, diarrhea, abdominal pain, fecal incontinence Genitourinary:  No dysuria, urinary retention or frequency Musculoskeletal:  No neck pain, back pain Integumentary: No rash, pruritus, skin lesions Neurological: as above Psychiatric: No depression, insomnia, anxiety Endocrine: No palpitations, fatigue, diaphoresis, mood swings, change in appetite, change in weight, increased thirst Hematologic/Lymphatic:  No anemia, purpura, petechiae. Allergic/Immunologic: no itchy/runny eyes, nasal congestion, recent allergic reactions, rashes  PHYSICAL EXAM: Filed Vitals:   03/01/14 1037  BP: 122/80  Pulse: 65  Temp: 98.5 F (36.9 C)   General: No acute distress Head:  Normocephalic/atraumatic Eyes: Fundoscopic exam shows bilateral sharp discs, no vessel changes, exudates, or hemorrhages Neck: supple, no paraspinal tenderness, full range of motion Back: No paraspinal tenderness Heart: regular rate and rhythm Lungs: Clear to auscultation bilaterally. Vascular: No carotid bruits. Skin/Extremities: No rash, no edema Neurological Exam: Mental status: alert and oriented to person, place, and time, no dysarthria or aphasia, Fund of knowledge is appropriate.  Remote intact.  Attention and concentration are normal.    Able to name objects and repeat phrases.  [place]real  Cognitive Assessment  03/04/2014  Visuospatial/ Executive (0/5) 3  Naming (0/3) 3  Attention: Read list of digits (0/2) 2  Attention: Read list of letters (0/1) 1  Attention: Serial 7 subtraction starting at 100 (0/3) 3  Language: Repeat phrase (0/2) 2  Language : Fluency (0/1) 1  Abstraction (0/2) 2  Delayed Recall (0/5) 0  Orientation (0/6) 4  Total 21  Adjusted Score (based on education) 21   Cranial nerves: CN I: not tested CN II: pupils equal, round and reactive to light, visual fields intact, fundi unremarkable. CN III, IV, VI:  full range of motion, no nystagmus, no ptosis CN V: facial sensation intact CN VII: upper and lower face symmetric CN VIII: hearing intact to finger rub CN IX, X: gag intact, uvula midline CN XI: sternocleidomastoid and trapezius muscles intact CN XII: tongue midline Bulk & Tone: normal, no cogwheeling, no fasciculations. Motor: 5/5 throughout with no pronator drift. Sensation: decreased vibration sense up to left ankle, otherwise intact to light touch, cold, pin, and joint position sense.  No extinction to double simultaneous stimulation.  Romberg test negative Deep Tendon Reflexes: brisk +  2 throughout except for bilateral +1 ankle jerks, no ankle clonus Plantar responses: downgoing bilaterally Cerebellar: no incoordination on finger to nose, heel to shin. No dysdiadochokinesia Gait: narrow-based and steady, able to tandem walk adequately. Tremor: none  IMPRESSION: This is a pleasant 68 year old right-handed woman with a history of breast cancer diagnosed in 2010 s/p chemotherapy and radiation, hyperlipidemia, presenting with worsening short-term memory over the past 6 months. Her neurological exam today shows a MOCA of 21/30, indicating mild cognitive impairment. MRI brain with and without contrast did not show any acute changes. We discussed mild cognitive impairment, different etiologies of memory loss, including pseudodementia and effect on mood on  memory. She and her husband stated that they both do not feel she is depressed.  We discussed the option for neuropsychological evaluation.  We also discussed the option for starting cholinesterase inhibitors such as Aricept, including side effects and expectations from the medication. Her husband had several questions regarding prognosis and medications, they would like to hold off on medication for now, however he is concerned he will not notice worsening memory and does not want to wait too long before starting medication if this will stabilize her current state. We discussed the importance of physical exercise and brain stimulation exercises, as well as control of vascular risk factors, for brain health.  She will follow-up in 3 months for re-evaluation.   Thank you for allowing me to participate in the care of this patient. Please do not hesitate to call for any questions or concerns.   Ellouise Newer, M.D.  CC: Dr. Moreen Fowler

## 2014-03-04 ENCOUNTER — Encounter: Payer: Self-pay | Admitting: Neurology

## 2014-03-04 DIAGNOSIS — R413 Other amnesia: Secondary | ICD-10-CM | POA: Insufficient documentation

## 2014-03-04 DIAGNOSIS — E785 Hyperlipidemia, unspecified: Secondary | ICD-10-CM | POA: Insufficient documentation

## 2014-03-07 ENCOUNTER — Telehealth: Payer: Self-pay | Admitting: Adult Health

## 2014-03-07 ENCOUNTER — Telehealth: Payer: Self-pay | Admitting: Hematology and Oncology

## 2014-03-07 NOTE — Telephone Encounter (Signed)
, °

## 2014-03-11 ENCOUNTER — Other Ambulatory Visit: Payer: Medicare Other

## 2014-03-11 ENCOUNTER — Ambulatory Visit (HOSPITAL_BASED_OUTPATIENT_CLINIC_OR_DEPARTMENT_OTHER): Payer: Medicare Other | Admitting: Hematology and Oncology

## 2014-03-11 ENCOUNTER — Ambulatory Visit (HOSPITAL_BASED_OUTPATIENT_CLINIC_OR_DEPARTMENT_OTHER): Payer: Medicare Other

## 2014-03-11 ENCOUNTER — Encounter: Payer: Self-pay | Admitting: Hematology and Oncology

## 2014-03-11 ENCOUNTER — Ambulatory Visit: Payer: Medicare Other | Admitting: Oncology

## 2014-03-11 VITALS — BP 143/91 | HR 68 | Temp 98.6°F | Resp 18 | Ht 68.0 in | Wt 138.0 lb

## 2014-03-11 DIAGNOSIS — C50419 Malignant neoplasm of upper-outer quadrant of unspecified female breast: Secondary | ICD-10-CM

## 2014-03-11 DIAGNOSIS — C50411 Malignant neoplasm of upper-outer quadrant of right female breast: Secondary | ICD-10-CM

## 2014-03-11 DIAGNOSIS — Z170 Estrogen receptor positive status [ER+]: Secondary | ICD-10-CM | POA: Diagnosis not present

## 2014-03-11 DIAGNOSIS — Z23 Encounter for immunization: Secondary | ICD-10-CM | POA: Diagnosis not present

## 2014-03-11 LAB — CBC WITH DIFFERENTIAL/PLATELET
BASO%: 0.8 % (ref 0.0–2.0)
Basophils Absolute: 0.1 10*3/uL (ref 0.0–0.1)
EOS%: 1.4 % (ref 0.0–7.0)
Eosinophils Absolute: 0.1 10*3/uL (ref 0.0–0.5)
HCT: 41.3 % (ref 34.8–46.6)
HGB: 13.7 g/dL (ref 11.6–15.9)
LYMPH%: 21.9 % (ref 14.0–49.7)
MCH: 32.2 pg (ref 25.1–34.0)
MCHC: 33.2 g/dL (ref 31.5–36.0)
MCV: 97 fL (ref 79.5–101.0)
MONO#: 0.5 10*3/uL (ref 0.1–0.9)
MONO%: 7.2 % (ref 0.0–14.0)
NEUT#: 4.4 10*3/uL (ref 1.5–6.5)
NEUT%: 68.7 % (ref 38.4–76.8)
Platelets: 226 10*3/uL (ref 145–400)
RBC: 4.25 10*6/uL (ref 3.70–5.45)
RDW: 12.8 % (ref 11.2–14.5)
WBC: 6.4 10*3/uL (ref 3.9–10.3)
lymph#: 1.4 10*3/uL (ref 0.9–3.3)

## 2014-03-11 LAB — COMPREHENSIVE METABOLIC PANEL (CC13)
ALT: 21 U/L (ref 0–55)
ANION GAP: 10 meq/L (ref 3–11)
AST: 20 U/L (ref 5–34)
Albumin: 4.3 g/dL (ref 3.5–5.0)
Alkaline Phosphatase: 80 U/L (ref 40–150)
BILIRUBIN TOTAL: 1.77 mg/dL — AB (ref 0.20–1.20)
BUN: 15.9 mg/dL (ref 7.0–26.0)
CO2: 25 mEq/L (ref 22–29)
Calcium: 10 mg/dL (ref 8.4–10.4)
Chloride: 108 mEq/L (ref 98–109)
Creatinine: 0.8 mg/dL (ref 0.6–1.1)
Glucose: 118 mg/dl (ref 70–140)
Potassium: 4.8 mEq/L (ref 3.5–5.1)
SODIUM: 143 meq/L (ref 136–145)
TOTAL PROTEIN: 7 g/dL (ref 6.4–8.3)

## 2014-03-11 NOTE — Assessment & Plan Note (Addendum)
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  Patient is tolerating Femara extremely well without any side effects. Her last year bone density was normal. Today's breast exam did not reveal any abnormalities. We will set her up for a mammogram. I recommended continuing Femara beyond 5 years with a full explanation that we do not have perspective data to support this. But she understands that tamoxifen 10 units was a period of 5 years. Given the fact that she has no positive disease and T2 disease, I believe she should continue beyond 5 years. She is also not having any adverse effects of treatment, which is influencing the decision.  Continue to monitor for AI related side effects. Return to clinic in 6 months for followup.

## 2014-03-11 NOTE — Progress Notes (Signed)
Patient Care Team: Maria Kroner, MD as PCP - General (Family Medicine)  DIAGNOSIS: Breast cancer of upper-outer quadrant of right female breast   Primary site: Breast (Right)   Staging method: AJCC 7th Edition   Pathologic: Stage IIB (T2, N1, cM0) signed by Rulon Eisenmenger, MD on 03/11/2014 11:09 AM   Summary: Stage IIB (T2, N1, cM0)   SUMMARY OF ONCOLOGIC HISTORY:   Breast cancer of upper-outer quadrant of right female breast   07/24/2008 Mammogram Right breast mass at 10:00 position 1.7 x 1.4 x 1.3 cm   07/31/2008 Initial Diagnosis Breast cancer of upper-outer quadrant of right female breast: 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 grade 1   09/16/2008 - 11/19/2008 Chemotherapy Taxotere Cytoxan x4 cycles   12/17/2008 - 01/28/2009 Radiation Therapy Radiation therapy to lumpectomy site   01/19/2009 -  Anti-estrogen oral therapy  Femara 2.5 mg daily    CHIEF COMPLIANT: Six-month followup of history of breast cancer  INTERVAL HISTORY: Maria Noble is a 68 year old Caucasian with above-mentioned history of right breast cancer treated with lumpectomy adjuvant chemotherapy, radiation followed by Femara. She has been on Femara for the past 5 years. She has tolerated it extremely well without any major problems or concerns. She had a bone density test done 2014 which was normal. She did not had mammograms done this year so far. She states reactive and goes and bike rides almost every day. This keeps her healthy. She has 3 dogs as well. She denies any lumps or nodules or any other concerns.  REVIEW OF SYSTEMS:   Constitutional: Denies fevers, chills or abnormal weight loss Eyes: Denies blurriness of vision Ears, nose, mouth, throat, and face: Denies mucositis or sore throat Respiratory: Denies cough, dyspnea or wheezes Cardiovascular: Denies  palpitation, chest discomfort or lower extremity swelling Gastrointestinal:  Denies nausea, heartburn or change in bowel habits Skin: Denies abnormal skin rashes Lymphatics: Denies new lymphadenopathy or easy bruising Neurological:Denies numbness, tingling or new weaknesses Behavioral/Psych: Mood is stable, no new changes  Breast:  denies any pain or lumps or nodules in either breasts All other systems were reviewed with the patient and are negative.  I have reviewed the past medical history, past surgical history, social history and family history with the patient and they are unchanged from previous note.  ALLERGIES:  is allergic to penicillin g and penicillins.  MEDICATIONS:  Current Outpatient Prescriptions  Medication Sig Dispense Refill  . ascorbic acid (VITAMIN C) 100 MG tablet Take 100 mg by mouth daily.      Marland Kitchen atorvastatin (LIPITOR) 40 MG tablet Take 40 mg by mouth daily.      . calcium carbonate (OS-CAL) 600 MG TABS Take 600 mg by mouth 2 (two) times daily with a meal.      . cholecalciferol (VITAMIN D) 1000 UNITS tablet Take 1,000 Units by mouth daily.      Marland Kitchen latanoprost (XALATAN) 0.005 % ophthalmic solution       . letrozole (FEMARA) 2.5 MG tablet Take 1 tablet (2.5 mg total) by mouth daily.  90 tablet  0  . Multiple Vitamins-Minerals (WOMENS ONE DAILY) TABS Take 1 tablet by mouth daily.      . timolol (TIMOPTIC) 0.5 % ophthalmic solution        No current facility-administered medications for this visit.  PHYSICAL EXAMINATION: ECOG PERFORMANCE STATUS: 0 - Asymptomatic  Filed Vitals:   03/11/14 1046  BP: 143/91  Pulse: 68  Temp: 98.6 F (37 C)  Resp: 18   Filed Weights   03/11/14 1046  Weight: 138 lb (62.596 kg)    GENERAL:alert, no distress and comfortable SKIN: skin color, texture, turgor are normal, no rashes or significant lesions EYES: normal, Conjunctiva are pink and non-injected, sclera clear OROPHARYNX:no exudate, no erythema and lips, buccal  mucosa, and tongue normal  NECK: supple, thyroid normal size, non-tender, without nodularity LYMPH:  no palpable lymphadenopathy in the cervical, axillary or inguinal LUNGS: clear to auscultation and percussion with normal breathing effort HEART: regular rate & rhythm and no murmurs and no lower extremity edema ABDOMEN:abdomen soft, non-tender and normal bowel sounds Musculoskeletal:no cyanosis of digits and no clubbing  NEURO: alert & oriented x 3 with fluent speech, no focal motor/sensory deficits BREAST: No palpable masses or nodules in either right or left breasts. No palpable axillary supraclavicular or infraclavicular adenopathy no breast tenderness or nipple discharge.   LABORATORY DATA:  I have reviewed the data as listed   Chemistry      Component Value Date/Time   NA 141 09/03/2013 1008   NA 142 09/02/2011 1616   K 3.9 09/03/2013 1008   K 3.8 09/02/2011 1616   CL 106 08/31/2012 1210   CL 104 09/02/2011 1616   CO2 23 09/03/2013 1008   CO2 32 09/02/2011 1616   BUN 8.0 09/03/2013 1008   BUN 9 09/02/2011 1616   CREATININE 0.8 09/03/2013 1008   CREATININE 0.70 09/02/2011 1616      Component Value Date/Time   CALCIUM 9.9 09/03/2013 1008   CALCIUM 9.6 09/02/2011 1616   ALKPHOS 86 09/03/2013 1008   ALKPHOS 72 09/02/2011 1616   AST 23 09/03/2013 1008   AST 25 09/02/2011 1616   ALT 22 09/03/2013 1008   ALT 19 09/02/2011 1616   BILITOT 1.31* 09/03/2013 1008   BILITOT 1.5* 09/06/2012 0853       Lab Results  Component Value Date   WBC 6.4 03/11/2014   HGB 13.7 03/11/2014   HCT 41.3 03/11/2014   MCV 97.0 03/11/2014   PLT 226 03/11/2014   NEUTROABS 4.4 03/11/2014     RADIOGRAPHIC STUDIES: I have personally reviewed the radiology reports and agreed with their findings. No results found.   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  Patient is tolerating Femara extremely well without any side effects. Her last year bone density was normal. Today's breast exam did not reveal any abnormalities. We will set her up for a mammogram. I recommended continuing Femara beyond 5 years with a full explanation that we do not have perspective data to support this. But she understands that tamoxifen 10 units was a period of 5 years. Given the fact that she has no positive disease and T2 disease, I believe she should continue beyond 5 years. She is also not having any adverse effects of treatment, which is influencing the decision.  Continue to monitor for AI related side effects. Return to clinic in 6 months for followup.   Orders Placed This Encounter  Procedures  . MM Digital Diagnostic Bilat    Standing Status: Future     Number of Occurrences:  Standing Expiration Date: 03/11/2015    Order Specific Question:  Reason for Exam (SYMPTOM  OR DIAGNOSIS REQUIRED)    Answer:  History of left breast cancer annual followup    Order Specific Question:  Preferred imaging location?    Answer:  [facility]   The patient has a good understanding of the overall plan. she agrees with it. She will call with any problems that may develop before her next visit here.  I spent 20 minutes counseling the patient face to face. The total time spent in the appointment was 25 minutes and more than 50% was on counseling and review of test results    Rulon Eisenmenger, MD 03/11/2014 11:16 AM

## 2014-03-13 ENCOUNTER — Telehealth: Payer: Self-pay | Admitting: Hematology and Oncology

## 2014-03-13 NOTE — Telephone Encounter (Signed)
, °

## 2014-03-19 ENCOUNTER — Other Ambulatory Visit: Payer: Medicare Other

## 2014-03-19 ENCOUNTER — Ambulatory Visit
Admission: RE | Admit: 2014-03-19 | Discharge: 2014-03-19 | Disposition: A | Discharge status: Home / Self Care | Payer: Medicare Other | Source: Ambulatory Visit | Attending: Hematology and Oncology | Admitting: Hematology and Oncology

## 2014-03-19 ENCOUNTER — Ambulatory Visit: Payer: Self-pay | Admitting: Adult Health

## 2014-03-19 DIAGNOSIS — R928 Other abnormal and inconclusive findings on diagnostic imaging of breast: Secondary | ICD-10-CM | POA: Diagnosis not present

## 2014-03-19 DIAGNOSIS — Z853 Personal history of malignant neoplasm of breast: Secondary | ICD-10-CM | POA: Diagnosis not present

## 2014-03-19 DIAGNOSIS — C50411 Malignant neoplasm of upper-outer quadrant of right female breast: Secondary | ICD-10-CM

## 2014-03-27 ENCOUNTER — Telehealth: Payer: Self-pay | Admitting: Neurology

## 2014-03-27 NOTE — Telephone Encounter (Signed)
Lmom to return my call. 

## 2014-03-27 NOTE — Telephone Encounter (Signed)
Pt husband Mikki Santee needs to talk to someone about rx please call [phone]

## 2014-03-28 MED ORDER — DONEPEZIL HCL 5 MG PO TABS: 5.0000 mg | ORAL_TABLET | Freq: Every day | ORAL | Status: DC

## 2014-03-28 NOTE — Telephone Encounter (Signed)
Spoke with pts husband again. Rx will be sent to pharmacy. They do use mail order, but he wanted to go ahead and get her started on med asap,. He asked that I send Rx to local pharmacy & [facility].  30 day rx was sent to [facility]. 90 day Rx was sent to [facility].

## 2014-03-28 NOTE — Telephone Encounter (Signed)
Start Aricept 5mg  once a day. Pls let them know side effects are nausea and diarrhea, take with food. Rx #30 with 6 refills, thanks

## 2014-03-28 NOTE — Telephone Encounter (Signed)
Spoke with pts husband/Bob. He states that he wants pt to go ahead and start Aricept. Pls advise.   [phone]

## 2014-04-05 ENCOUNTER — Other Ambulatory Visit: Payer: Self-pay

## 2014-04-24 ENCOUNTER — Other Ambulatory Visit: Payer: Self-pay | Admitting: *Deleted

## 2014-04-24 DIAGNOSIS — C50919 Malignant neoplasm of unspecified site of unspecified female breast: Secondary | ICD-10-CM

## 2014-04-24 MED ORDER — LETROZOLE 2.5 MG PO TABS: 2.5000 mg | ORAL_TABLET | Freq: Every day | ORAL | Status: DC

## 2014-04-29 DIAGNOSIS — H5340 Unspecified visual field defects: Secondary | ICD-10-CM | POA: Diagnosis not present

## 2014-04-29 DIAGNOSIS — H4011X1 Primary open-angle glaucoma, mild stage: Secondary | ICD-10-CM | POA: Diagnosis not present

## 2014-04-29 DIAGNOSIS — H4010X1 Unspecified open-angle glaucoma, mild stage: Secondary | ICD-10-CM | POA: Diagnosis not present

## 2014-05-31 ENCOUNTER — Encounter: Payer: Self-pay | Admitting: Neurology

## 2014-05-31 ENCOUNTER — Ambulatory Visit (INDEPENDENT_AMBULATORY_CARE_PROVIDER_SITE_OTHER): Payer: Medicare Other | Admitting: Neurology

## 2014-05-31 VITALS — BP 118/78 | HR 66 | Resp 16 | Ht 68.0 in | Wt 143.0 lb

## 2014-05-31 DIAGNOSIS — R413 Other amnesia: Secondary | ICD-10-CM

## 2014-05-31 NOTE — Progress Notes (Signed)
NEUROLOGY FOLLOW UP OFFICE NOTE  Maria Noble [id number]  HISTORY OF PRESENT ILLNESS: I had the pleasure of seeing Maria Noble in follow-up in the neurology clinic on 05/31/2014.  The patient was last seen 3 months ago for worsening memory loss. MOCA score was 21/30, indicating mild cognitive impairment. MRI brain unremarkable. She and her husband decided to start Aricept, currently at 5mg /day with no side effects. Her husband has not noticed much difference in memory. She feels she is doing fine. She continues to drive without getting lost. They continue to be active with social interactions. She also exercises regularly.    She denies any headaches, dizziness, diplopia, dysarthria, dysphagia, neck/back pain, focal numbness/tingling/weakness, bowel/bladder dysfunction, tremors.  HPI:  This is a pleasant 68 yo RH woman with a history of breast cancer s/p chemotherapy and radiation in 2010, hyperlipidemia, who presented for worsening memory loss. The patient herself does not notice this much, her husband has expressed more concern. Maria Noble does agree that her memory is not as good as it used to be. She states she occasionally forgets to take her medication but this is rare. She drives short distances and does not get lost. She continues to write checks and denies any missed bill payments. She is active and rides her bike daily and works in the yard. Her husband has noticed that she would ask the same questions about a topic they just talked about. She used to be an avid reader but has stopped doing this because she cannot follow the story. She has become more socially withdrawn. He states she has good and bad days and symptoms come in spurts. He denies any other personality changes, no paranoia. No word-finding difficulties. She reports refreshing sleep, seldom snores. There is a family history of Alzheimer's in her mother diagnosed at age 60.  Laboratory Data 01/17/14: CBC, CMP normal.  TSH 3.88, vitamin B12 453. HbA1c 5.5.  I personally reviewed MRI brain with and without contrast which did not show any acute changes. There is mild diffuse atrophy, mild to moderate bilateral chronic microvascular ischemic changes, and tiny areas of hemosiderin deposition can be seen in on gradient sequence involving the right frontal, parietal, occipital, and temporal cortex and subcortical white matter.  PAST MEDICAL HISTORY: Past Medical History  Diagnosis Date  . Breast cancer   . Glaucoma     MEDICATIONS: Current Outpatient Prescriptions on File Prior to Visit  Medication Sig Dispense Refill  . ascorbic acid (VITAMIN C) 100 MG tablet Take 100 mg by mouth daily.    Marland Kitchen atorvastatin (LIPITOR) 40 MG tablet Take 40 mg by mouth daily.    . calcium carbonate (OS-CAL) 600 MG TABS Take 600 mg by mouth 2 (two) times daily with a meal.    . cholecalciferol (VITAMIN D) 1000 UNITS tablet Take 1,000 Units by mouth daily.    Marland Kitchen donepezil (ARICEPT) 5 MG tablet Take 1 tablet (5 mg total) by mouth at bedtime. 90 tablet 1  . latanoprost (XALATAN) 0.005 % ophthalmic solution     . letrozole (FEMARA) 2.5 MG tablet Take 1 tablet (2.5 mg total) by mouth daily. 90 tablet 1  . Multiple Vitamins-Minerals (WOMENS ONE DAILY) TABS Take 1 tablet by mouth daily.    . timolol (TIMOPTIC) 0.5 % ophthalmic solution      No current facility-administered medications on file prior to visit.    ALLERGIES: Allergies  Allergen Reactions  . Penicillin G   . Penicillins  FAMILY HISTORY: Family History  Problem Relation Age of Onset  . Dementia Mother   . Heart Problems Father   . Heart attack Brother   . Cancer Brother     SOCIAL HISTORY: History   Social History  . Marital Status: Married    Spouse Name: N/A    Number of Children: N/A  . Years of Education: N/A   Occupational History  . Not on file.   Social History Main Topics  . Smoking status: Former Smoker    Types: Cigarettes  .  Smokeless tobacco: Never Used  . Alcohol Use: 4.2 oz/week    7 Glasses of wine per week  . Drug Use: No  . Sexual Activity: Yes   Other Topics Concern  . Not on file   Social History Narrative    REVIEW OF SYSTEMS: Constitutional: No fevers, chills, or sweats, no generalized fatigue, change in appetite Eyes: No visual changes, double vision, eye pain Ear, nose and throat: No hearing loss, ear pain, nasal congestion, sore throat Cardiovascular: No chest pain, palpitations Respiratory:  No shortness of breath at rest or with exertion, wheezes GastrointestinaI: No nausea, vomiting, diarrhea, abdominal pain, fecal incontinence Genitourinary:  No dysuria, urinary retention or frequency Musculoskeletal:  No neck pain, back pain Integumentary: No rash, pruritus, skin lesions Neurological: as above Psychiatric: No depression, insomnia, anxiety Endocrine: No palpitations, fatigue, diaphoresis, mood swings, change in appetite, change in weight, increased thirst Hematologic/Lymphatic:  No anemia, purpura, petechiae. Allergic/Immunologic: no itchy/runny eyes, nasal congestion, recent allergic reactions, rashes  PHYSICAL EXAM: Filed Vitals:   05/31/14 1032  BP: 118/78  Pulse: 66  Resp: 16   General: No acute distress Head:  Normocephalic/atraumatic Neck: supple, no paraspinal tenderness, full range of motion Heart:  Regular rate and rhythm Lungs:  Clear to auscultation bilaterally Back: No paraspinal tenderness Skin/Extremities: No rash, no edema Neurological Exam: alert and oriented to person, place, month/year. No aphasia or dysarthria. Fund of knowledge is appropriate.  Remote memory intact.  Attention and concentration are normal.    Able to name objects and repeat phrases.  Montreal Cognitive Assessment  05/31/2014 03/04/2014  Visuospatial/ Executive (0/5) 2 3  Naming (0/3) 3 3  Attention: Read list of digits (0/2) 2 2  Attention: Read list of letters (0/1) 1 1  Attention:  Serial 7 subtraction starting at 100 (0/3) 0 3  Language: Repeat phrase (0/2) 2 2  Language : Fluency (0/1) 0 1  Abstraction (0/2) 2 2  Delayed Recall (0/5) 0 0  Orientation (0/6) 5 4  Total 17 21  Adjusted Score (based on education) 17 21   Cranial nerves: Pupils equal, round, reactive to light.  Fundoscopic exam unremarkable, no papilledema. Extraocular movements intact with no nystagmus. Visual fields full. Facial sensation intact. No facial asymmetry. Tongue, uvula, palate midline.  Motor: Bulk and tone normal, muscle strength 5/5 throughout with no pronator drift.  Sensation to light touch intact.  No extinction to double simultaneous stimulation.  Deep tendon reflexes brisk 2+ throughout except for +1 ankle jerks, toes downgoing.  Finger to nose testing intact.  Gait narrow-based and steady, able to tandem walk adequately.  Romberg negative.  IMPRESSION: This is a pleasant 68 yo RH woman with a history of breast cancer diagnosed in 2010 s/p chemotherapy and radiation, hyperlipidemia, who presented with worsening short-term memory. Her neurological exam today shows a MOCA of 17/30, previously 21/30 3 months ago, however she is more anxious today and did not want to participate  with parts of the MOCA (subtraction, fluency/naming). She had difficulties with visuospatial tasks and recall. MRI brain with and without contrast did not show any acute changes. We had an extensive discussion regarding symptoms, option to increase dose of Aricept to 10mg /day, and option for neuropsychological evaluation. We will plan to increase Aricept to 10mg  on her next refill. Her husband is interested in potentially looking at ongoing trials at [facility] for memory loss. We discussed the importance of physical exercise and brain stimulation exercises, as well as control of vascular risk factors, for brain health. She will follow-up in 3 months.  Thank you for allowing me to participate in her care.  Please do not hesitate  to call for any questions or concerns.  The duration of this appointment visit was 15 minutes of face-to-face time with the patient.  Greater than 50% of this time was spent in counseling, explanation of diagnosis, planning of further management, and coordination of care.   Ellouise Newer, M.D.   CC: Dr. Moreen Fowler

## 2014-05-31 NOTE — Patient Instructions (Signed)
1. Continue Aricept 5mg  daily. Plan to increase to 10mg  on next refill, call our office at this point 2. Continue brain stimulation exercises, physical exercise 3. Follow-up in 3 months

## 2014-06-04 ENCOUNTER — Encounter: Payer: Self-pay | Admitting: Neurology

## 2014-07-23 ENCOUNTER — Telehealth: Payer: Self-pay | Admitting: Neurology

## 2014-07-23 NOTE — Telephone Encounter (Signed)
Herbie Baltimore, pt's spouse called requesting a refill for DONEPEZIL 10MG  Pharmacy: [facility] [phone]

## 2014-07-24 ENCOUNTER — Other Ambulatory Visit: Payer: Self-pay | Admitting: Family Medicine

## 2014-07-24 MED ORDER — DONEPEZIL HCL 10 MG PO TABS: 10.0000 mg | ORAL_TABLET | Freq: Every day | ORAL | Status: DC

## 2014-07-24 NOTE — Telephone Encounter (Signed)
Rx sent to patient's pharmacy

## 2014-07-26 NOTE — Telephone Encounter (Signed)
Pt had someone call today and leave a message on the voice mail about a rx refill please call [phone]

## 2014-07-26 NOTE — Telephone Encounter (Signed)
Patient's husband was the person that called. I spoke with him he was requesting that we give [place] a call. Patient's Aricept was recently increased from 5 to 10 mg. The new Rx was sent to [facility] which flagged patient's account because they had 5 mg Rx on file for her. They needed [place] to call to verify that patient had a change in therapy.  I did call the pharmacy and clarified that patient is now to be on 10 mg. The 5 mg Rx that they had on file was cancelled & they will continue to process the 10 mg Rx & send out order once complete.  Called patient's husband and notified of above.

## 2014-08-27 DIAGNOSIS — Z87891 Personal history of nicotine dependence: Secondary | ICD-10-CM | POA: Diagnosis not present

## 2014-08-27 DIAGNOSIS — R55 Syncope and collapse: Secondary | ICD-10-CM | POA: Diagnosis not present

## 2014-08-27 DIAGNOSIS — E785 Hyperlipidemia, unspecified: Secondary | ICD-10-CM | POA: Diagnosis not present

## 2014-08-27 DIAGNOSIS — R42 Dizziness and giddiness: Secondary | ICD-10-CM | POA: Diagnosis not present

## 2014-08-27 DIAGNOSIS — R404 Transient alteration of awareness: Secondary | ICD-10-CM | POA: Diagnosis not present

## 2014-08-27 DIAGNOSIS — R4182 Altered mental status, unspecified: Secondary | ICD-10-CM | POA: Diagnosis not present

## 2014-08-27 DIAGNOSIS — Z853 Personal history of malignant neoplasm of breast: Secondary | ICD-10-CM | POA: Diagnosis not present

## 2014-08-27 DIAGNOSIS — R110 Nausea: Secondary | ICD-10-CM | POA: Diagnosis not present

## 2014-08-27 DIAGNOSIS — R51 Headache: Secondary | ICD-10-CM | POA: Diagnosis not present

## 2014-08-29 DIAGNOSIS — G3184 Mild cognitive impairment, so stated: Secondary | ICD-10-CM | POA: Diagnosis not present

## 2014-08-29 DIAGNOSIS — R42 Dizziness and giddiness: Secondary | ICD-10-CM | POA: Diagnosis not present

## 2014-08-29 DIAGNOSIS — E785 Hyperlipidemia, unspecified: Secondary | ICD-10-CM | POA: Diagnosis not present

## 2014-09-05 ENCOUNTER — Other Ambulatory Visit: Payer: Self-pay | Admitting: Hematology and Oncology

## 2014-09-05 NOTE — Telephone Encounter (Signed)
Last ov 08/2013.  Next ov 08/2014.  Chart reviewed

## 2014-09-10 ENCOUNTER — Ambulatory Visit (HOSPITAL_BASED_OUTPATIENT_CLINIC_OR_DEPARTMENT_OTHER): Payer: Medicare Other | Admitting: Hematology and Oncology

## 2014-09-10 ENCOUNTER — Ambulatory Visit (INDEPENDENT_AMBULATORY_CARE_PROVIDER_SITE_OTHER): Payer: Medicare Other | Admitting: Neurology

## 2014-09-10 ENCOUNTER — Other Ambulatory Visit: Payer: Self-pay | Admitting: Hematology and Oncology

## 2014-09-10 ENCOUNTER — Telehealth: Payer: Self-pay | Admitting: Hematology and Oncology

## 2014-09-10 ENCOUNTER — Encounter: Payer: Self-pay | Admitting: Neurology

## 2014-09-10 VITALS — BP 120/82 | HR 71 | Resp 16 | Ht 68.0 in | Wt 146.0 lb

## 2014-09-10 VITALS — BP 131/74 | HR 82 | Temp 97.9°F | Resp 18 | Ht 68.0 in | Wt 148.8 lb

## 2014-09-10 DIAGNOSIS — Z170 Estrogen receptor positive status [ER+]: Secondary | ICD-10-CM | POA: Diagnosis not present

## 2014-09-10 DIAGNOSIS — Z853 Personal history of malignant neoplasm of breast: Secondary | ICD-10-CM

## 2014-09-10 DIAGNOSIS — C50411 Malignant neoplasm of upper-outer quadrant of right female breast: Secondary | ICD-10-CM

## 2014-09-10 DIAGNOSIS — G3184 Mild cognitive impairment, so stated: Secondary | ICD-10-CM

## 2014-09-10 DIAGNOSIS — E2839 Other primary ovarian failure: Secondary | ICD-10-CM

## 2014-09-10 DIAGNOSIS — Z780 Asymptomatic menopausal state: Secondary | ICD-10-CM

## 2014-09-10 DIAGNOSIS — Z9189 Other specified personal risk factors, not elsewhere classified: Secondary | ICD-10-CM | POA: Insufficient documentation

## 2014-09-10 NOTE — Progress Notes (Deleted)
NEUROLOGY FOLLOW UP OFFICE NOTE  TENESIA ESCUDERO [id number]  HISTORY OF PRESENT ILLNESS: I had the pleasure of seeing Mazal Ebey in follow-up in the neurology clinic on 09/10/2014.  The patient was last seen 3 months ago for worsening memory. She is again accompanied by her husband today. On her last visit, her husband had not noticed much difference in her memory. Aricept dose was increased to 10mg /day.  Maybe a little better, I can remember things better Husband: about the same, maybe a little better Vertigo last week, on a walking tour, looking up and down No focal sx, no bowel/bladder, no anosmia; no tremors  I had the pleasure of seeing Kamiryn Bezanson in follow-up in the neurology clinic on 05/31/2014. The patient was last seen 3 months ago for worsening memory loss. MOCA score was 21/30, indicating mild cognitive impairment. MRI brain unremarkable. She and her husband decided to start Aricept, currently at 5mg /day with no side effects. Her husband has not noticed much difference in memory. She feels she is doing fine. She continues to drive without getting lost. They continue to be active with social interactions. She also exercises regularly.   She denies any headaches, dizziness, diplopia, dysarthria, dysphagia, neck/back pain, focal numbness/tingling/weakness, bowel/bladder dysfunction, tremors.  HPI: This is a pleasant 69 yo RH woman with a history of breast cancer s/p chemotherapy and radiation in 2010, hyperlipidemia, who presented for worsening memory loss. The patient herself does not notice this much, her husband has expressed more concern. Ms. Hartung does agree that her memory is not as good as it used to be. She states she occasionally forgets to take her medication but this is rare. She drives short distances and does not get lost. She continues to write checks and denies any missed bill payments. She is active and rides her bike daily and works in the yard. Her  husband has noticed that she would ask the same questions about a topic they just talked about. She used to be an avid reader but has stopped doing this because she cannot follow the story. She has become more socially withdrawn. He states she has good and bad days and symptoms come in spurts. He denies any other personality changes, no paranoia. No word-finding difficulties. She reports refreshing sleep, seldom snores. There is a family history of Alzheimer's in her mother diagnosed at age 24.  Laboratory Data 01/17/14: CBC, CMP normal. TSH 3.88, vitamin B12 453. HbA1c 5.5.  I personally reviewed MRI brain with and without contrast which did not show any acute changes. There is mild diffuse atrophy, mild to moderate bilateral chronic microvascular ischemic changes, and tiny areas of hemosiderin deposition can be seen in on gradient sequence involving the right frontal, parietal, occipital, and temporal cortex and subcortical white matter.PAST MEDICAL HISTORY: Past Medical History  Diagnosis Date  . Breast cancer   . Glaucoma     MEDICATIONS: Current Outpatient Prescriptions on File Prior to Visit  Medication Sig Dispense Refill  . ascorbic acid (VITAMIN C) 100 MG tablet Take 100 mg by mouth daily.    Marland Kitchen atorvastatin (LIPITOR) 40 MG tablet Take 40 mg by mouth daily.    . calcium carbonate (OS-CAL) 600 MG TABS Take 600 mg by mouth 2 (two) times daily with a meal.    . cholecalciferol (VITAMIN D) 1000 UNITS tablet Take 1,000 Units by mouth daily.    Marland Kitchen donepezil (ARICEPT) 10 MG tablet Take 1 tablet (10 mg total) by mouth at bedtime. [place]  tablet 3  . latanoprost (XALATAN) 0.005 % ophthalmic solution Place 1 drop into both eyes at bedtime.     Marland Kitchen letrozole (FEMARA) 2.5 MG tablet TAKE 1 TABLET EVERY DAY 30 tablet 0  . Multiple Vitamins-Minerals (WOMENS ONE DAILY) TABS Take 1 tablet by mouth daily.    . timolol (TIMOPTIC) 0.5 % ophthalmic solution Place 1 drop into both eyes daily.      No current  facility-administered medications on file prior to visit.    ALLERGIES: Allergies  Allergen Reactions  . Penicillin G   . Penicillins     FAMILY HISTORY: Family History  Problem Relation Age of Onset  . Dementia Mother   . Heart Problems Father   . Heart attack Brother   . Cancer Brother     SOCIAL HISTORY: History   Social History  . Marital Status: Married    Spouse Name: N/A  . Number of Children: N/A  . Years of Education: N/A   Occupational History  . Not on file.   Social History Main Topics  . Smoking status: Former Smoker    Types: Cigarettes  . Smokeless tobacco: Never Used  . Alcohol Use: 4.2 oz/week    7 Glasses of wine per week  . Drug Use: No  . Sexual Activity: Yes   Other Topics Concern  . Not on file   Social History Narrative    REVIEW OF SYSTEMS: Constitutional: No fevers, chills, or sweats, no generalized fatigue, change in appetite Eyes: No visual changes, double vision, eye pain Ear, nose and throat: No hearing loss, ear pain, nasal congestion, sore throat Cardiovascular: No chest pain, palpitations Respiratory:  No shortness of breath at rest or with exertion, wheezes GastrointestinaI: No nausea, vomiting, diarrhea, abdominal pain, fecal incontinence Genitourinary:  No dysuria, urinary retention or frequency Musculoskeletal:  No neck pain, back pain Integumentary: No rash, pruritus, skin lesions Neurological: as above Psychiatric: No depression, insomnia, anxiety Endocrine: No palpitations, fatigue, diaphoresis, mood swings, change in appetite, change in weight, increased thirst Hematologic/Lymphatic:  No anemia, purpura, petechiae. Allergic/Immunologic: no itchy/runny eyes, nasal congestion, recent allergic reactions, rashes  PHYSICAL EXAM: Filed Vitals:   09/10/14 1021  BP: 120/82  Pulse: 71  Resp: 16   General: No acute distress Head:  Normocephalic/atraumatic Neck: supple, no paraspinal tenderness, full range of  motion Heart:  Regular rate and rhythm Lungs:  Clear to auscultation bilaterally Back: No paraspinal tenderness Skin/Extremities: No rash, no edema Neurological Exam: alert and oriented to person, place, and time. No aphasia or dysarthria. Fund of knowledge is appropriate.  Recent and remote memory are intact.  Attention and concentration are normal.    Able to name objects and repeat phrases. Cranial nerves: Pupils equal, round, reactive to light.  Fundoscopic exam unremarkable, no papilledema. Extraocular movements intact with no nystagmus. Visual fields full. Facial sensation intact. No facial asymmetry. Tongue, uvula, palate midline.  Motor: Bulk and tone normal, muscle strength 5/5 throughout with no pronator drift.  Sensation to light touch, temperature and vibration intact.  No extinction to double simultaneous stimulation.  Deep tendon reflexes 2+ throughout, toes downgoing.  Finger to nose testing intact.  Gait narrow-based and steady, able to tandem walk adequately.  Romberg negative.  IMPRESSION: This is a pleasant 69 yo RH woman with a history of breast cancer diagnosed in 2010 s/p chemotherapy and radiation, hyperlipidemia, who presented with worsening short-term memory. Her neurological exam today shows a MOCA of 17/30, previously 21/30 3 months ago, however she  is more anxious today and did not want to participate with parts of the MOCA (subtraction, fluency/naming). She had difficulties with visuospatial tasks and recall. MRI brain with and without contrast did not show any acute changes. We had an extensive discussion regarding symptoms, option to increase dose of Aricept to 10mg /day, and option for neuropsychological evaluation. We will plan to increase Aricept to 10mg  on her next refill. Her husband is interested in potentially looking at ongoing trials at [facility] for memory loss. We discussed the importance of physical exercise and brain stimulation exercises, as well as control of vascular  risk factors, for brain health. She will follow-up in 3 months.      Medications: Diagnostic testing: Labs: Imaging: Consults: Return to clinic in *** months.  Thank you for allowing me to participate in *** care.  Please do not hesitate to call for any questions or concerns.  The duration of this appointment visit was *** minutes of face-to-face time with the patient.  Greater than 50% of this time was spent in counseling, explanation of diagnosis, planning of further management, and coordination of care.   Ellouise Newer, M.D.   CC: ***

## 2014-09-10 NOTE — Patient Instructions (Signed)
1. Continue daily Aricept 2. Continue physical exercise and brain stimulation exercises for brain health 3. Follow-up in 6 months

## 2014-09-10 NOTE — Telephone Encounter (Signed)
Gave patient husband avs report and appointments for mammo/bone density sept 2016 and f/u with [facility] for oct 2016.

## 2014-09-10 NOTE — Progress Notes (Signed)
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 grade 1   09/16/2008 - 11/19/2008 Chemotherapy Taxotere Cytoxan x4 cycles   12/17/2008 - 01/28/2009 Radiation Therapy Radiation therapy to lumpectomy site   01/19/2009 -  Anti-estrogen oral therapy  Femara 2.5 mg daily    CHIEF COMPLIANT: Follow-up on Femara  INTERVAL HISTORY: Maria Noble is a 69 year old lady with above-mentioned history of right-sided breast cancer treated with lumpectomy and adjuvant chemotherapy and radiation currently on Femara 2.5 mg daily. She is tolerating Femara extremely well. She has completed 5 years of therapy. She elected to stay on Femara indefinitely. We discussed the risks and benefits of long-term antiestrogen therapy. Patient understands his risks and is wanting to stay on it. We also discussed today the risks and benefits of sending for breast cancer index testing. But because she has made up her mind on staying on the medication, I do not think that would be of much benefit.  REVIEW OF SYSTEMS:   Constitutional: Denies fevers, chills or abnormal weight loss Eyes: Denies blurriness of vision Ears, nose, mouth, throat, and face: Denies mucositis or sore throat Respiratory: Denies cough, dyspnea or  wheezes Cardiovascular: Denies palpitation, chest discomfort or lower extremity swelling Gastrointestinal:  Denies nausea, heartburn or change in bowel habits Skin: Denies abnormal skin rashes Lymphatics: Denies new lymphadenopathy or easy bruising Neurological:Denies numbness, tingling or new weaknesses Behavioral/Psych: Mood is stable, no new changes  Breast:  denies any pain or lumps or nodules in either breasts All other systems were reviewed with the patient and are negative.  I have reviewed the past medical history, past surgical history, social history and family history with the patient and they are unchanged from previous note.  ALLERGIES:  is allergic to penicillin g and penicillins.  MEDICATIONS:  Current Outpatient Prescriptions  Medication Sig Dispense Refill  . ascorbic acid (VITAMIN C) 100 MG tablet Take 100 mg by mouth daily.    Marland Kitchen atorvastatin (LIPITOR) 40 MG tablet Take 40 mg by mouth daily.    . calcium carbonate (OS-CAL) 600 MG TABS Take 600 mg by mouth 2 (two) times daily with a meal.    . cholecalciferol (VITAMIN D) 1000 UNITS tablet Take 1,000 Units by mouth daily.    Marland Kitchen donepezil (ARICEPT) 10 MG tablet Take 1 tablet (10 mg total) by mouth at bedtime. 90 tablet 3  . latanoprost (XALATAN) 0.005 % ophthalmic solution Place 1 drop into both eyes at bedtime.     Marland Kitchen letrozole (FEMARA) 2.5 MG tablet TAKE 1 TABLET EVERY DAY 30 tablet 0  . loratadine (CLARITIN) 10 MG tablet Take 10 mg by mouth daily.    . Multiple Vitamins-Minerals (WOMENS ONE DAILY) TABS Take 1 tablet by  mouth daily.    . timolol (TIMOPTIC) 0.5 % ophthalmic solution Place 1 drop into both eyes daily.      No current facility-administered medications for this visit.    PHYSICAL EXAMINATION: ECOG PERFORMANCE STATUS: 0 - Asymptomatic  Filed Vitals:   09/10/14 1345  BP: 131/74  Pulse: 82  Temp: 97.9 F (36.6 C)  Resp: 18   Filed Weights   09/10/14 1345  Weight: 148 lb 12.8 oz (67.495 kg)     GENERAL:alert, no distress and comfortable SKIN: skin color, texture, turgor are normal, no rashes or significant lesions EYES: normal, Conjunctiva are pink and non-injected, sclera clear OROPHARYNX:no exudate, no erythema and lips, buccal mucosa, and tongue normal  NECK: supple, thyroid normal size, non-tender, without nodularity LYMPH:  no palpable lymphadenopathy in the cervical, axillary or inguinal LUNGS: clear to auscultation and percussion with normal breathing effort HEART: regular rate & rhythm and no murmurs and no lower extremity edema ABDOMEN:abdomen soft, non-tender and normal bowel sounds Musculoskeletal:no cyanosis of digits and no clubbing  NEURO: alert & oriented x 3 with fluent speech, no focal motor/sensory deficits BREAST: No palpable masses or nodules in either right or left breasts. No palpable axillary supraclavicular or infraclavicular adenopathy no breast tenderness or nipple discharge. (exam performed in the presence of a chaperone)  LABORATORY DATA:  I have reviewed the data as listed   Chemistry      Component Value Date/Time   NA 143 03/11/2014 1035   NA 142 09/02/2011 1616   K 4.8 03/11/2014 1035   K 3.8 09/02/2011 1616   CL 106 08/31/2012 1210   CL 104 09/02/2011 1616   CO2 25 03/11/2014 1035   CO2 32 09/02/2011 1616   BUN 15.9 03/11/2014 1035   BUN 9 09/02/2011 1616   CREATININE 0.8 03/11/2014 1035   CREATININE 0.70 09/02/2011 1616      Component Value Date/Time   CALCIUM 10.0 03/11/2014 1035   CALCIUM 9.6 09/02/2011 1616   ALKPHOS 80 03/11/2014 1035   ALKPHOS 72 09/02/2011 1616   AST 20 03/11/2014 1035   AST 25 09/02/2011 1616   ALT 21 03/11/2014 1035   ALT 19 09/02/2011 1616   BILITOT 1.77* 03/11/2014 1035   BILITOT 1.5* 09/06/2012 0853       Lab Results  Component Value Date   WBC 6.4 03/11/2014   HGB 13.7 03/11/2014   HCT 41.3 03/11/2014   MCV 97.0 03/11/2014   PLT 226 03/11/2014   NEUTROABS 4.4 03/11/2014   ASSESSMENT &  PLAN:  Right breast invasive ductal carcinoma T2, N1, 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  Femara toxicities:  1. No major side effects to Femara. 2. Bone density done 2014 revealed normal bones. She will get another bone density in September 2016.  Breast cancer surveillance: 1. Breast examination 09/10/2014 is normal 2. Mammogram September 2015 is normal  Return to clinic in 6 months for follow-up    Orders Placed This Encounter  Procedures  . DG Bone Density    Please coordinated with her mammogram appointment to be done in September 2060    Standing Status: Future     Number of Occurrences:      Standing Expiration Date: 09/10/2015    Order Specific Question:  Reason for Exam (SYMPTOM  OR DIAGNOSIS REQUIRED)    Answer:  On anti estrogen therapy    Order Specific Question:  Preferred imaging location?    Answer:  [facility]   The patient has a good understanding of the overall plan. she agrees with it. She will call with any problems that may develop before her next visit here.   Rulon Eisenmenger, MD

## 2014-09-15 DIAGNOSIS — G3184 Mild cognitive impairment, so stated: Secondary | ICD-10-CM | POA: Insufficient documentation

## 2014-09-15 NOTE — Progress Notes (Signed)
NEUROLOGY FOLLOW UP OFFICE NOTE  Maria Noble [id number]  HISTORY OF PRESENT ILLNESS: I had the pleasure of seeing Maria Noble in follow-up in the neurology clinic on 09/10/2014.  The patient was last seen 3 months ago for worsening memory. She is again accompanied by her husband today. On her last visit, her husband had not noticed much difference in her memory. Her MOCA score at that time was 17/30 (21/30 on previous visit). Aricept dose was increased to 10mg /day.  She feels her memory is "maybe a little better, I can remember thing better." Her husband states she is about the same, maybe a little better. Her husband does the driving. She continues to be active, no difficulties with ADLs. No missed medications. She had a bout of vertigo last week while on a walking tour looking up and down. This has resolved. She denies any headaches, focal numbness/tingling/weakness, anosmia, bowel/bladder dysfunction. No falls.   HPI: This is a pleasant 69 yo RH woman with a history of breast cancer s/p chemotherapy and radiation in 2010, hyperlipidemia, who presented for worsening memory loss. The patient herself does not notice this much, her husband has expressed more concern. Maria Noble does agree that her memory is not as good as it used to be. She states she occasionally forgets to take her medication but this is rare. She drives short distances and does not get lost. She continues to write checks and denies any missed bill payments. She is active and rides her bike daily and works in the yard. Her husband has noticed that she would ask the same questions about a topic they just talked about. She used to be an avid reader but has stopped doing this because she cannot follow the story. She has become more socially withdrawn. He states she has good and bad days and symptoms come in spurts. He denies any other personality changes, no paranoia. No word-finding difficulties. She reports refreshing sleep,  seldom snores. There is a family history of Alzheimer's in her mother diagnosed at age 39.  Laboratory Data 01/17/14: CBC, CMP normal. TSH 3.88, vitamin B12 453. HbA1c 5.5.  I personally reviewed MRI brain with and without contrast which did not show any acute changes. There is mild diffuse atrophy, mild to moderate bilateral chronic microvascular ischemic changes, and tiny areas of hemosiderin deposition can be seen in on gradient sequence involving the right frontal, parietal, occipital, and temporal cortex and subcortical white matter.  PAST MEDICAL HISTORY: Past Medical History  Diagnosis Date  . Breast cancer   . Glaucoma     MEDICATIONS: Current Outpatient Prescriptions on File Prior to Visit  Medication Sig Dispense Refill  . ascorbic acid (VITAMIN C) 100 MG tablet Take 100 mg by mouth daily.    Marland Kitchen atorvastatin (LIPITOR) 40 MG tablet Take 40 mg by mouth daily.    . calcium carbonate (OS-CAL) 600 MG TABS Take 600 mg by mouth 2 (two) times daily with a meal.    . cholecalciferol (VITAMIN D) 1000 UNITS tablet Take 1,000 Units by mouth daily.    Marland Kitchen donepezil (ARICEPT) 10 MG tablet Take 1 tablet (10 mg total) by mouth at bedtime. 90 tablet 3  . latanoprost (XALATAN) 0.005 % ophthalmic solution Place 1 drop into both eyes at bedtime.     Marland Kitchen letrozole (FEMARA) 2.5 MG tablet TAKE 1 TABLET EVERY DAY 30 tablet 0  . Multiple Vitamins-Minerals (WOMENS ONE DAILY) TABS Take 1 tablet by mouth daily.    Marland Kitchen  timolol (TIMOPTIC) 0.5 % ophthalmic solution Place 1 drop into both eyes daily.      No current facility-administered medications on file prior to visit.    ALLERGIES: Allergies  Allergen Reactions  . Penicillin G   . Penicillins     FAMILY HISTORY: Family History  Problem Relation Age of Onset  . Dementia Mother   . Heart Problems Father   . Heart attack Brother   . Cancer Brother     SOCIAL HISTORY: History   Social History  . Marital Status: Married    Spouse Name: N/A  .  Number of Children: N/A  . Years of Education: N/A   Occupational History  . Not on file.   Social History Main Topics  . Smoking status: Former Smoker    Types: Cigarettes  . Smokeless tobacco: Never Used  . Alcohol Use: 4.2 oz/week    7 Glasses of wine per week  . Drug Use: No  . Sexual Activity: Yes   Other Topics Concern  . Not on file   Social History Narrative    REVIEW OF SYSTEMS: Constitutional: No fevers, chills, or sweats, no generalized fatigue, change in appetite Eyes: No visual changes, double vision, eye pain Ear, nose and throat: No hearing loss, ear pain, nasal congestion, sore throat Cardiovascular: No chest pain, palpitations Respiratory:  No shortness of breath at rest or with exertion, wheezes GastrointestinaI: No nausea, vomiting, diarrhea, abdominal pain, fecal incontinence Genitourinary:  No dysuria, urinary retention or frequency Musculoskeletal:  No neck pain, back pain Integumentary: No rash, pruritus, skin lesions Neurological: as above Psychiatric: No depression, insomnia, anxiety Endocrine: No palpitations, fatigue, diaphoresis, mood swings, change in appetite, change in weight, increased thirst Hematologic/Lymphatic:  No anemia, purpura, petechiae. Allergic/Immunologic: no itchy/runny eyes, nasal congestion, recent allergic reactions, rashes  PHYSICAL EXAM: Filed Vitals:   09/10/14 1021  BP: 120/82  Pulse: 71  Resp: 16   General: No acute distress, flat affect, appears disinterested/indifferent Head:  Normocephalic/atraumatic Neck: supple, no paraspinal tenderness, full range of motion Heart:  Regular rate and rhythm Lungs:  Clear to auscultation bilaterally Back: No paraspinal tenderness Skin/Extremities: No rash, no edema Neurological Exam: alert and oriented to person, place, and time. No aphasia or dysarthria. Fund of knowledge is appropriate.  Remote memory intact.  Attention and concentration are normal.    Able to name objects  and repeat phrases. She again refused to do math. MMSE - Mini Mental State Exam 09/10/2014  Orientation to time 4  Orientation to Place 3  Registration 3  Attention/ Calculation 2  Recall 0  Language- name 2 objects 2  Language- repeat 1  Language- follow 3 step command 3  Language- read & follow direction 1  Write a sentence 1  Copy design 1  Total score 21   Cranial nerves: Pupils equal, round, reactive to light.  Fundoscopic exam unremarkable, no papilledema. Extraocular movements intact with no nystagmus. Visual fields full. Facial sensation intact. No facial asymmetry. Tongue, uvula, palate midline.  Motor: Bulk and tone normal, muscle strength 5/5 throughout with no pronator drift.  Sensation to light touch, temperature and vibration intact.  No extinction to double simultaneous stimulation.  Deep tendon reflexes 2+ throughout except for +1 ankle jerks bilaterally, toes downgoing.  Finger to nose testing intact.  Gait narrow-based and steady, able to tandem walk adequately.  Romberg negative.  IMPRESSION: This is a pleasant 69 yo RH woman with a history of breast cancer diagnosed in 2010 s/p chemotherapy  and radiation, hyperlipidemia, who presented with worsening short-term memory. Her neurological exam today shows an MMSE of 21/30, however she refused to do some parts. Diagnosis at this time mild cognitive impairment, possible mild dementia. We again discussed indications and expectations from medications. Continue Aricept 10mg  daily. Option for neuropsychological evaluation was again discussed to assess if there are other factors potentially contributing (ie depression, patient has flat affect and appears indifferent). We discussed the importance of physical exercise and brain stimulation exercises, as well as control of vascular risk factors, for brain health. She will follow-up in 6 months.   Thank you for allowing me to participate in her care.  Please do not hesitate to call for any  questions or concerns.  The duration of this appointment visit was 15 minutes of face-to-face time with the patient.  Greater than 50% of this time was spent in counseling, explanation of diagnosis, planning of further management, and coordination of care.   Maria Noble, M.D.   CC: Dr. Moreen Fowler

## 2014-10-30 DIAGNOSIS — H47239 Glaucomatous optic atrophy, unspecified eye: Secondary | ICD-10-CM | POA: Diagnosis not present

## 2014-10-30 DIAGNOSIS — H52222 Regular astigmatism, left eye: Secondary | ICD-10-CM | POA: Diagnosis not present

## 2014-10-30 DIAGNOSIS — H524 Presbyopia: Secondary | ICD-10-CM | POA: Diagnosis not present

## 2014-10-30 DIAGNOSIS — H5201 Hypermetropia, right eye: Secondary | ICD-10-CM | POA: Diagnosis not present

## 2014-12-16 ENCOUNTER — Other Ambulatory Visit: Payer: Self-pay

## 2015-03-13 ENCOUNTER — Ambulatory Visit (INDEPENDENT_AMBULATORY_CARE_PROVIDER_SITE_OTHER): Payer: Medicare Other | Admitting: Neurology

## 2015-03-13 ENCOUNTER — Encounter: Payer: Self-pay | Admitting: Neurology

## 2015-03-13 VITALS — BP 100/72 | HR 69 | Resp 16 | Ht 68.0 in | Wt 153.0 lb

## 2015-03-13 DIAGNOSIS — G3184 Mild cognitive impairment, so stated: Secondary | ICD-10-CM

## 2015-03-13 DIAGNOSIS — Q150 Congenital glaucoma: Secondary | ICD-10-CM | POA: Diagnosis not present

## 2015-03-13 DIAGNOSIS — H4010X2 Unspecified open-angle glaucoma, moderate stage: Secondary | ICD-10-CM | POA: Diagnosis not present

## 2015-03-13 DIAGNOSIS — H4011X2 Primary open-angle glaucoma, moderate stage: Secondary | ICD-10-CM | POA: Diagnosis not present

## 2015-03-13 MED ORDER — DONEPEZIL HCL 10 MG PO TABS: 10.0000 mg | ORAL_TABLET | Freq: Every day | ORAL | Status: AC

## 2015-03-13 NOTE — Progress Notes (Addendum)
NEUROLOGY FOLLOW UP OFFICE NOTE  Maria Noble [id number]  HISTORY OF PRESENT ILLNESS: I had the pleasure of seeing Maria Noble in follow-up in the neurology clinic on 03/13/2015.  The patient was last seen 6 months ago for worsening memory. She is again accompanied by her husband today. On her last visit, her husband had not noticed much difference in her memory. MMSE in March 2016 was 21/30, she had refused to do some parts.She is currently on Aricept 10mg  daily with no side effects. Her husband continues to report that he has not noticed much change, but feels that he is with her all the time that he may not notice. There was one incident where she locked herself out of the house, but was cognizant to get the spare key in the car, however broke the key in the lock. Otherwise, she occasionally asks the same thing repeatedly, but thinks it is less than before. She denies getting lost driving, no missed medications. She denies any headaches, dizziness, diplopia, focal numbness/tingling/weakness, no falls.   HPI: This is a pleasant 69 yo RH woman with a history of breast cancer s/p chemotherapy and radiation in 2010, hyperlipidemia, who presented for worsening memory loss. The patient herself does not notice this much, her husband has expressed more concern. Maria Noble does agree that her memory is not as good as it used to be. She states she occasionally forgets to take her medication but this is rare. She drives short distances and does not get lost. She continues to write checks and denies any missed bill payments. She is active and rides her bike daily and works in the yard. Her husband has noticed that she would ask the same questions about a topic they just talked about. She used to be an avid reader but has stopped doing this because she cannot follow the story. She has become more socially withdrawn. He states she has good and bad days and symptoms come in spurts. He denies any other  personality changes, no paranoia. No word-finding difficulties. She reports refreshing sleep, seldom snores. There is a family history of Alzheimer's in her mother diagnosed at age 51.  Laboratory Data 01/17/14: CBC, CMP normal. TSH 3.88, vitamin B12 453. HbA1c 5.5.  I personally reviewed MRI brain with and without contrast which did not show any acute changes. There is mild diffuse atrophy, mild to moderate bilateral chronic microvascular ischemic changes, and tiny areas of hemosiderin deposition can be seen in on gradient sequence involving the right frontal, parietal, occipital, and temporal cortex and subcortical white matter.  PAST MEDICAL HISTORY: Past Medical History  Diagnosis Date  . Breast cancer   . Glaucoma     MEDICATIONS: Current Outpatient Prescriptions on File Prior to Visit  Medication Sig Dispense Refill  . ascorbic acid (VITAMIN C) 100 MG tablet Take 100 mg by mouth daily.    Marland Kitchen atorvastatin (LIPITOR) 40 MG tablet Take 40 mg by mouth daily.    . calcium carbonate (OS-CAL) 600 MG TABS Take 600 mg by mouth 2 (two) times daily with a meal.    . cholecalciferol (VITAMIN D) 1000 UNITS tablet Take 1,000 Units by mouth daily.    Marland Kitchen donepezil (ARICEPT) 10 MG tablet Take 1 tablet (10 mg total) by mouth at bedtime. 90 tablet 3  . latanoprost (XALATAN) 0.005 % ophthalmic solution Place 1 drop into both eyes at bedtime.     Marland Kitchen letrozole (FEMARA) 2.5 MG tablet TAKE 1 TABLET EVERY DAY 30  tablet 0  . loratadine (CLARITIN) 10 MG tablet Take 10 mg by mouth daily.    . Multiple Vitamins-Minerals (WOMENS ONE DAILY) TABS Take 1 tablet by mouth daily.    . timolol (TIMOPTIC) 0.5 % ophthalmic solution Place 1 drop into both eyes daily.      No current facility-administered medications on file prior to visit.    ALLERGIES: Allergies  Allergen Reactions  . Penicillin G   . Penicillins     FAMILY HISTORY: Family History  Problem Relation Age of Onset  . Dementia Mother   . Heart  Problems Father   . Heart attack Brother   . Cancer Brother     SOCIAL HISTORY: Social History   Social History  . Marital Status: Married    Spouse Name: N/A  . Number of Children: N/A  . Years of Education: N/A   Occupational History  . Not on file.   Social History Main Topics  . Smoking status: Former Smoker    Types: Cigarettes  . Smokeless tobacco: Never Used  . Alcohol Use: 4.2 oz/week    7 Glasses of wine per week  . Drug Use: No  . Sexual Activity: Yes   Other Topics Concern  . Not on file   Social History Narrative    REVIEW OF SYSTEMS: Constitutional: No fevers, chills, or sweats, no generalized fatigue, change in appetite Eyes: No visual changes, double vision, eye pain Ear, nose and throat: No hearing loss, ear pain, nasal congestion, sore throat Cardiovascular: No chest pain, palpitations Respiratory:  No shortness of breath at rest or with exertion, wheezes GastrointestinaI: No nausea, vomiting, diarrhea, abdominal pain, fecal incontinence Genitourinary:  No dysuria, urinary retention or frequency Musculoskeletal:  No neck pain, back pain Integumentary: No rash, pruritus, skin lesions Neurological: as above Psychiatric: No depression, insomnia, anxiety Endocrine: No palpitations, fatigue, diaphoresis, mood swings, change in appetite, change in weight, increased thirst Hematologic/Lymphatic:  No anemia, purpura, petechiae. Allergic/Immunologic: no itchy/runny eyes, nasal congestion, recent allergic reactions, rashes  PHYSICAL EXAM: Filed Vitals:   03/13/15 1021  BP: 100/72  Pulse: 69  Resp: 16   General: No acute distress Head:  Normocephalic/atraumatic Neck: supple, no paraspinal tenderness, full range of motion Heart:  Regular rate and rhythm Lungs:  Clear to auscultation bilaterally Back: No paraspinal tenderness Skin/Extremities: No rash, no edema Neurological Exam: alert and oriented to person, place, season and year. Stated month is  August, day Tuesday (it is September, Thursday). No aphasia or dysarthria. Fund of knowledge is appropriate.  Remote memory intact.  Attention and concentration are normal.    Able to name objects and repeat phrases. Clock drawing test 3/5 (unable to put hands) MMSE - Mini Mental State Exam 03/13/2015 09/10/2014  Orientation to time 2 4  Orientation to Place 4 3  Registration 3 3  Attention/ Calculation 4 2  Recall 0 0  Language- name 2 objects 2 2  Language- repeat 1 1  Language- follow 3 step command 3 3  Language- read & follow direction 1 1  Write a sentence 1 1  Copy design 0 1  Total score 21 21   Cranial nerves: Pupils equal, round, reactive to light.  Fundoscopic exam unremarkable, no papilledema. Extraocular movements intact with no nystagmus. Visual fields full. Facial sensation intact. No facial asymmetry. Tongue, uvula, palate midline.  Motor: Bulk and tone normal, muscle strength 5/5 throughout with no pronator drift.  Sensation to light touch intact.  No extinction to double simultaneous  stimulation.  Deep tendon reflexes 2+ throughout, toes downgoing.  Finger to nose testing intact.  Gait narrow-based and steady, able to tandem walk adequately.  Romberg negative.  IMPRESSION: This is a pleasant 69 yo RH woman with a history of breast cancer diagnosed in 2010 s/p chemotherapy and radiation, hyperlipidemia, who presented with worsening short-term memory. Her neurological exam today similar to previous, 21/30. By history, symptoms suggestive of mild cognitive impairment. Continue Aricept 10mg  daily. Her husband is concerned that we are not being pro-active, and is asking about research studies. We again discussed doing neuropsychological evaluation to further evaluate her symptoms, as well as getting a second opinion at [facility], to see if she would be a candidate for any studies. They would like to think about it and will let Maria Noble know their preference. We discussed the  importance of physical exercise and brain stimulation exercises, as well as control of vascular risk factors, for brain health. She will follow-up in 6 months.   Thank you for allowing me to participate in her care.  Please do not hesitate to call for any questions or concerns.  The duration of this appointment visit was 25 minutes of face-to-face time with the patient.  Greater than 50% of this time was spent in counseling, explanation of diagnosis, planning of further management, and coordination of care.   Ellouise Newer, M.D.   CC: Dr. Moreen Fowler

## 2015-03-13 NOTE — Patient Instructions (Signed)
1. Continue Aricept 10mg  daily 2. Continue physical exercise and brain stimulation exercises for brain health 3. Call our office if you decide proceed with Neurocognitive testing or [facility] referral 4. Follow-up in 6 months

## 2015-03-17 ENCOUNTER — Encounter: Payer: Self-pay | Admitting: Neurology

## 2015-03-21 ENCOUNTER — Other Ambulatory Visit: Payer: Medicare Other

## 2015-03-24 NOTE — Assessment & Plan Note (Signed)
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  Femara toxicities:  1. No major side effects to Femara. Patient completed 5 years of therapy. I recommended BCI test to determine if patient needs extended adjuvant therapy.  Breast cancer surveillance: 1. Breast examination 03/25/2015 is normal 2. Mammogram and bone density October 2016  Return to clinic in 1 year for follow-up

## 2015-03-25 ENCOUNTER — Telehealth: Payer: Self-pay | Admitting: Hematology and Oncology

## 2015-03-25 ENCOUNTER — Ambulatory Visit (HOSPITAL_BASED_OUTPATIENT_CLINIC_OR_DEPARTMENT_OTHER): Payer: Medicare Other | Admitting: Hematology and Oncology

## 2015-03-25 ENCOUNTER — Encounter: Payer: Self-pay | Admitting: Hematology and Oncology

## 2015-03-25 VITALS — BP 123/73 | HR 67 | Temp 98.2°F | Resp 18 | Ht 68.0 in | Wt 151.9 lb

## 2015-03-25 DIAGNOSIS — Z170 Estrogen receptor positive status [ER+]: Secondary | ICD-10-CM

## 2015-03-25 DIAGNOSIS — C50411 Malignant neoplasm of upper-outer quadrant of right female breast: Secondary | ICD-10-CM | POA: Diagnosis not present

## 2015-03-25 DIAGNOSIS — Z79811 Long term (current) use of aromatase inhibitors: Secondary | ICD-10-CM

## 2015-03-25 NOTE — Progress Notes (Signed)
Patient Care Team: Antony Contras, MD as PCP - General (Family Medicine)  DIAGNOSIS: Breast cancer of upper-outer quadrant of right female breast [facility])   Staging form: Breast, AJCC 7th Edition     Clinical: No stage assigned - Unsigned     Pathologic: Stage IIB (T2, N1, cM0) - Signed by Rulon Eisenmenger, MD on 03/11/2014   SUMMARY OF ONCOLOGIC HISTORY:   Breast cancer of upper-outer quadrant of right female breast ([place])   07/24/2008 Mammogram Right breast mass at 10:00 position 1.7 x 1.4 x 1.3 cm   07/31/2008 Initial Diagnosis Breast cancer of upper-outer quadrant of right female breast: 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 grade 1   09/16/2008 - 11/19/2008 Chemotherapy Taxotere Cytoxan x4 cycles   12/17/2008 - 01/28/2009 Radiation Therapy Radiation therapy to lumpectomy site   01/19/2009 -  Anti-estrogen oral therapy  Femara 2.5 mg daily    CHIEF COMPLIANT:  Follow-up of breast cancer on Femara  INTERVAL HISTORY: Maria Noble is a   69 year old with above-mentioned history of right breast cancer currently on Femara. She is tolerating it very well. She is here for six-month follow-up. She denies any new problems or concerns. She is scheduled to undergo mammogram and bone density test in October.  REVIEW OF SYSTEMS:   Constitutional: Denies fevers, chills or abnormal weight loss Eyes: Denies blurriness of vision Ears, nose, mouth, throat, and face: Denies mucositis or sore throat Respiratory: Denies cough, dyspnea or wheezes Cardiovascular: Denies palpitation, chest discomfort or lower extremity swelling Gastrointestinal:  Denies nausea, heartburn or change in bowel habits Skin: Denies abnormal skin rashes Lymphatics: Denies new lymphadenopathy or easy bruising Neurological:Denies numbness, tingling or new weaknesses Behavioral/Psych: Mood  is stable, no new changes  Breast:  denies any pain or lumps or nodules in either breasts All other systems were reviewed with the patient and are negative.  I have reviewed the past medical history, past surgical history, social history and family history with the patient and they are unchanged from previous note.  ALLERGIES:  is allergic to penicillin g and penicillins.  MEDICATIONS:  Current Outpatient Prescriptions  Medication Sig Dispense Refill  . ascorbic acid (VITAMIN C) 100 MG tablet Take 100 mg by mouth daily.    Marland Kitchen atorvastatin (LIPITOR) 40 MG tablet Take 40 mg by mouth daily.    . calcium carbonate (OS-CAL) 600 MG TABS Take 600 mg by mouth 2 (two) times daily with a meal.    . cholecalciferol (VITAMIN D) 1000 UNITS tablet Take 1,000 Units by mouth daily.    Marland Kitchen donepezil (ARICEPT) 10 MG tablet Take 1 tablet (10 mg total) by mouth at bedtime. 90 tablet 3  . latanoprost (XALATAN) 0.005 % ophthalmic solution Place 1 drop into both eyes at bedtime.     Marland Kitchen letrozole (FEMARA) 2.5 MG tablet TAKE 1 TABLET EVERY DAY 30 tablet 0  . loratadine (CLARITIN) 10 MG tablet Take 10 mg by mouth daily.    . Multiple Vitamins-Minerals (WOMENS ONE DAILY) TABS Take 1 tablet by mouth daily.    . timolol (TIMOPTIC) 0.5 % ophthalmic solution Place 1 drop into both eyes daily.      No current facility-administered medications for this visit.    PHYSICAL EXAMINATION: ECOG PERFORMANCE STATUS: 0 - Asymptomatic  Filed Vitals:   03/25/15 1018  BP: 123/73  Pulse: 67  Temp: 98.2 F (36.8 C)  Resp: 18   Filed Weights   03/25/15 1018  Weight: 151 lb 14.4 oz (68.901 kg)    GENERAL:alert, no distress and comfortable SKIN: skin color, texture, turgor are normal, no rashes or significant lesions EYES: normal, Conjunctiva are pink and non-injected, sclera clear OROPHARYNX:no exudate, no erythema and lips, buccal mucosa, and tongue normal  NECK: supple, thyroid normal size, non-tender, without  nodularity LYMPH:  no palpable lymphadenopathy in the cervical, axillary or inguinal LUNGS: clear to auscultation and percussion with normal breathing effort HEART: regular rate & rhythm and no murmurs and no lower extremity edema ABDOMEN:abdomen soft, non-tender and normal bowel sounds Musculoskeletal:no cyanosis of digits and no clubbing  NEURO: alert & oriented x 3 with fluent speech, no focal motor/sensory deficits BREAST: No palpable masses or nodules in either right or left breasts. No palpable axillary supraclavicular or infraclavicular adenopathy no breast tenderness or nipple discharge. (exam performed in the presence of a chaperone)  LABORATORY DATA:  I have reviewed the data as listed   Chemistry      Component Value Date/Time   NA 143 03/11/2014 1035   NA 142 09/02/2011 1616   K 4.8 03/11/2014 1035   K 3.8 09/02/2011 1616   CL 106 08/31/2012 1210   CL 104 09/02/2011 1616   CO2 25 03/11/2014 1035   CO2 32 09/02/2011 1616   BUN 15.9 03/11/2014 1035   BUN 9 09/02/2011 1616   CREATININE 0.8 03/11/2014 1035   CREATININE 0.70 09/02/2011 1616      Component Value Date/Time   CALCIUM 10.0 03/11/2014 1035   CALCIUM 9.6 09/02/2011 1616   ALKPHOS 80 03/11/2014 1035   ALKPHOS 72 09/02/2011 1616   AST 20 03/11/2014 1035   AST 25 09/02/2011 1616   ALT 21 03/11/2014 1035   ALT 19 09/02/2011 1616   BILITOT 1.77* 03/11/2014 1035   BILITOT 1.5* 09/06/2012 0853       Lab Results  Component Value Date   WBC 6.4 03/11/2014   HGB 13.7 03/11/2014   HCT 41.3 03/11/2014   MCV 97.0 03/11/2014   PLT 226 03/11/2014   NEUTROABS 4.4 03/11/2014    ASSESSMENT & PLAN:  Breast cancer of upper-outer quadrant of right female breast Right breast invasive ductal carcinoma T2, N1, 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  Femara toxicities:  1. No major side  effects to Femara. Patient completed 6 years of therapy.  Patient is not having any side effects to antiestrogen therapy and would like to stay on this medicine for extended period of time. I discussed the results of the MA 17 clinical trial as well as other options including testing with breast cancer index. After all this discussion we elected to remain on antiestrogen therapy and monitor her closely for her bone health.  Breast cancer surveillance: 1. Breast examination 03/25/2015 is normal 2. Mammogram and bone density October 2016 Previous bone density done April 2014  Showed a T score of -0.2  Return to clinic in 1 year for follow-up    No orders of the defined types were placed in this encounter.   The patient has a good understanding of the overall plan. she agrees with it. she will call with any problems that may develop before the next visit here.   Rulon Eisenmenger, MD

## 2015-03-25 NOTE — Telephone Encounter (Signed)
Appointments made and avs printed for patient °

## 2015-04-03 DIAGNOSIS — Z01419 Encounter for gynecological examination (general) (routine) without abnormal findings: Secondary | ICD-10-CM | POA: Diagnosis not present

## 2015-04-03 DIAGNOSIS — Z1289 Encounter for screening for malignant neoplasm of other sites: Secondary | ICD-10-CM | POA: Diagnosis not present

## 2015-04-04 ENCOUNTER — Other Ambulatory Visit: Payer: Self-pay | Admitting: Hematology and Oncology

## 2015-04-04 ENCOUNTER — Other Ambulatory Visit: Payer: Self-pay

## 2015-04-04 DIAGNOSIS — E2839 Other primary ovarian failure: Secondary | ICD-10-CM

## 2015-04-08 ENCOUNTER — Ambulatory Visit
Admission: RE | Admit: 2015-04-08 | Discharge: 2015-04-08 | Disposition: A | Discharge status: Home / Self Care | Payer: Medicare Other | Source: Ambulatory Visit | Attending: Hematology and Oncology | Admitting: Hematology and Oncology

## 2015-04-08 ENCOUNTER — Inpatient Hospital Stay: Admission: RE | Admit: 2015-04-08 | Payer: Medicare Other | Source: Ambulatory Visit

## 2015-04-08 DIAGNOSIS — Z853 Personal history of malignant neoplasm of breast: Secondary | ICD-10-CM

## 2015-04-08 DIAGNOSIS — Z23 Encounter for immunization: Secondary | ICD-10-CM | POA: Diagnosis not present

## 2015-04-08 DIAGNOSIS — R928 Other abnormal and inconclusive findings on diagnostic imaging of breast: Secondary | ICD-10-CM | POA: Diagnosis not present

## 2015-04-25 ENCOUNTER — Ambulatory Visit
Admission: RE | Admit: 2015-04-25 | Discharge: 2015-04-25 | Disposition: A | Discharge status: Home / Self Care | Payer: Medicare Other | Source: Ambulatory Visit | Attending: Hematology and Oncology | Admitting: Hematology and Oncology

## 2015-04-25 DIAGNOSIS — E2839 Other primary ovarian failure: Secondary | ICD-10-CM

## 2015-04-25 DIAGNOSIS — M85851 Other specified disorders of bone density and structure, right thigh: Secondary | ICD-10-CM | POA: Diagnosis not present

## 2015-05-12 DIAGNOSIS — I680 Cerebral amyloid angiopathy: Secondary | ICD-10-CM | POA: Diagnosis not present

## 2015-05-12 DIAGNOSIS — F0280 Dementia in other diseases classified elsewhere without behavioral disturbance: Secondary | ICD-10-CM | POA: Diagnosis not present

## 2015-05-12 DIAGNOSIS — E854 Organ-limited amyloidosis: Secondary | ICD-10-CM | POA: Diagnosis not present

## 2015-05-12 DIAGNOSIS — G301 Alzheimer's disease with late onset: Secondary | ICD-10-CM | POA: Diagnosis not present

## 2015-06-15 ENCOUNTER — Other Ambulatory Visit: Payer: Self-pay | Admitting: Hematology and Oncology

## 2015-06-17 ENCOUNTER — Other Ambulatory Visit: Payer: Self-pay | Admitting: *Deleted

## 2015-06-17 DIAGNOSIS — C50411 Malignant neoplasm of upper-outer quadrant of right female breast: Secondary | ICD-10-CM

## 2015-06-17 MED ORDER — LETROZOLE 2.5 MG PO TABS: 2.5000 mg | ORAL_TABLET | Freq: Every day | ORAL | Status: DC

## 2015-07-30 DIAGNOSIS — H401122 Primary open-angle glaucoma, left eye, moderate stage: Secondary | ICD-10-CM | POA: Diagnosis not present

## 2015-07-30 DIAGNOSIS — H401112 Primary open-angle glaucoma, right eye, moderate stage: Secondary | ICD-10-CM | POA: Diagnosis not present

## 2015-08-08 DIAGNOSIS — E782 Mixed hyperlipidemia: Secondary | ICD-10-CM | POA: Diagnosis not present

## 2015-08-08 DIAGNOSIS — G309 Alzheimer's disease, unspecified: Secondary | ICD-10-CM | POA: Diagnosis not present

## 2015-08-08 DIAGNOSIS — Z23 Encounter for immunization: Secondary | ICD-10-CM | POA: Diagnosis not present

## 2015-08-08 DIAGNOSIS — Z1389 Encounter for screening for other disorder: Secondary | ICD-10-CM | POA: Diagnosis not present

## 2015-08-08 DIAGNOSIS — C50919 Malignant neoplasm of unspecified site of unspecified female breast: Secondary | ICD-10-CM | POA: Diagnosis not present

## 2015-09-11 ENCOUNTER — Ambulatory Visit: Payer: Medicare Other | Admitting: Neurology

## 2015-09-11 DIAGNOSIS — Z029 Encounter for administrative examinations, unspecified: Secondary | ICD-10-CM

## 2015-11-06 DIAGNOSIS — H401122 Primary open-angle glaucoma, left eye, moderate stage: Secondary | ICD-10-CM | POA: Diagnosis not present

## 2015-11-06 DIAGNOSIS — H5203 Hypermetropia, bilateral: Secondary | ICD-10-CM | POA: Diagnosis not present

## 2015-11-06 DIAGNOSIS — H401111 Primary open-angle glaucoma, right eye, mild stage: Secondary | ICD-10-CM | POA: Diagnosis not present

## 2015-12-10 ENCOUNTER — Other Ambulatory Visit: Payer: Self-pay | Admitting: Neurology

## 2016-01-23 DIAGNOSIS — Z1389 Encounter for screening for other disorder: Secondary | ICD-10-CM | POA: Diagnosis not present

## 2016-01-23 DIAGNOSIS — R946 Abnormal results of thyroid function studies: Secondary | ICD-10-CM | POA: Diagnosis not present

## 2016-01-23 DIAGNOSIS — Z0000 Encounter for general adult medical examination without abnormal findings: Secondary | ICD-10-CM | POA: Diagnosis not present

## 2016-01-23 DIAGNOSIS — Z1211 Encounter for screening for malignant neoplasm of colon: Secondary | ICD-10-CM | POA: Diagnosis not present

## 2016-01-23 DIAGNOSIS — G309 Alzheimer's disease, unspecified: Secondary | ICD-10-CM | POA: Diagnosis not present

## 2016-01-23 DIAGNOSIS — E782 Mixed hyperlipidemia: Secondary | ICD-10-CM | POA: Diagnosis not present

## 2016-01-23 DIAGNOSIS — Z23 Encounter for immunization: Secondary | ICD-10-CM | POA: Diagnosis not present

## 2016-01-23 DIAGNOSIS — C50919 Malignant neoplasm of unspecified site of unspecified female breast: Secondary | ICD-10-CM | POA: Diagnosis not present

## 2016-02-21 ENCOUNTER — Other Ambulatory Visit: Payer: Self-pay | Admitting: Hematology and Oncology

## 2016-02-21 DIAGNOSIS — C50411 Malignant neoplasm of upper-outer quadrant of right female breast: Secondary | ICD-10-CM

## 2016-03-08 DIAGNOSIS — H401131 Primary open-angle glaucoma, bilateral, mild stage: Secondary | ICD-10-CM | POA: Diagnosis not present

## 2016-03-08 DIAGNOSIS — E039 Hypothyroidism, unspecified: Secondary | ICD-10-CM | POA: Diagnosis not present

## 2016-03-08 DIAGNOSIS — Z23 Encounter for immunization: Secondary | ICD-10-CM | POA: Diagnosis not present

## 2016-03-08 DIAGNOSIS — Q150 Congenital glaucoma: Secondary | ICD-10-CM | POA: Diagnosis not present

## 2016-03-08 DIAGNOSIS — R946 Abnormal results of thyroid function studies: Secondary | ICD-10-CM | POA: Diagnosis not present

## 2016-03-19 ENCOUNTER — Other Ambulatory Visit: Payer: Self-pay | Admitting: Family Medicine

## 2016-03-19 DIAGNOSIS — Z853 Personal history of malignant neoplasm of breast: Secondary | ICD-10-CM

## 2016-03-23 NOTE — Assessment & Plan Note (Deleted)
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  Femara toxicities:  1. No major side effects to Femara. Patient completed 6 years of therapy.  Patient is not having any side effects to antiestrogen therapy and would like to stay on this medicine for extended period of time. I discussed the results of the MA 17 clinical trial as well as other options including testing with breast cancer index. After all this discussion we elected to remain on antiestrogen therapy and monitor her closely for her bone health.  Breast cancer surveillance: 1. Breast examination 03/26/2015 is normal 2. Mammogram and bone density October 2016 Previous bone density done April 2014  Showed a T score of -0.2  Return to clinic in 1 year for follow-up

## 2016-03-25 ENCOUNTER — Ambulatory Visit: Payer: Medicare Other | Admitting: Hematology and Oncology

## 2016-04-19 ENCOUNTER — Ambulatory Visit
Admission: RE | Admit: 2016-04-19 | Discharge: 2016-04-19 | Disposition: A | Discharge status: Home / Self Care | Payer: Medicare Other | Source: Ambulatory Visit | Attending: Family Medicine | Admitting: Family Medicine

## 2016-04-19 DIAGNOSIS — R922 Inconclusive mammogram: Secondary | ICD-10-CM | POA: Diagnosis not present

## 2016-04-19 DIAGNOSIS — Z853 Personal history of malignant neoplasm of breast: Secondary | ICD-10-CM

## 2016-05-04 DIAGNOSIS — E039 Hypothyroidism, unspecified: Secondary | ICD-10-CM | POA: Diagnosis not present

## 2016-05-10 DIAGNOSIS — E854 Organ-limited amyloidosis: Secondary | ICD-10-CM | POA: Diagnosis not present

## 2016-05-10 DIAGNOSIS — I680 Cerebral amyloid angiopathy: Secondary | ICD-10-CM | POA: Diagnosis not present

## 2016-05-10 DIAGNOSIS — G301 Alzheimer's disease with late onset: Secondary | ICD-10-CM | POA: Diagnosis not present

## 2016-05-10 DIAGNOSIS — F0280 Dementia in other diseases classified elsewhere without behavioral disturbance: Secondary | ICD-10-CM | POA: Diagnosis not present

## 2016-07-12 DIAGNOSIS — H3589 Other specified retinal disorders: Secondary | ICD-10-CM | POA: Diagnosis not present

## 2016-07-12 DIAGNOSIS — H401131 Primary open-angle glaucoma, bilateral, mild stage: Secondary | ICD-10-CM | POA: Diagnosis not present

## 2016-07-12 DIAGNOSIS — H5340 Unspecified visual field defects: Secondary | ICD-10-CM | POA: Diagnosis not present

## 2016-07-12 DIAGNOSIS — R109 Unspecified abdominal pain: Secondary | ICD-10-CM | POA: Diagnosis not present

## 2016-07-27 DIAGNOSIS — E039 Hypothyroidism, unspecified: Secondary | ICD-10-CM | POA: Diagnosis not present

## 2016-07-27 DIAGNOSIS — C50919 Malignant neoplasm of unspecified site of unspecified female breast: Secondary | ICD-10-CM | POA: Diagnosis not present

## 2016-07-27 DIAGNOSIS — E782 Mixed hyperlipidemia: Secondary | ICD-10-CM | POA: Diagnosis not present

## 2016-07-27 DIAGNOSIS — G309 Alzheimer's disease, unspecified: Secondary | ICD-10-CM | POA: Diagnosis not present

## 2016-07-27 DIAGNOSIS — R109 Unspecified abdominal pain: Secondary | ICD-10-CM | POA: Diagnosis not present

## 2016-11-08 DIAGNOSIS — H524 Presbyopia: Secondary | ICD-10-CM | POA: Diagnosis not present

## 2016-11-08 DIAGNOSIS — H401130 Primary open-angle glaucoma, bilateral, stage unspecified: Secondary | ICD-10-CM | POA: Diagnosis not present

## 2016-11-08 DIAGNOSIS — H52223 Regular astigmatism, bilateral: Secondary | ICD-10-CM | POA: Diagnosis not present

## 2016-11-08 DIAGNOSIS — H5203 Hypermetropia, bilateral: Secondary | ICD-10-CM | POA: Diagnosis not present

## 2016-12-24 ENCOUNTER — Other Ambulatory Visit: Payer: Self-pay | Admitting: Hematology and Oncology

## 2016-12-24 DIAGNOSIS — C50411 Malignant neoplasm of upper-outer quadrant of right female breast: Secondary | ICD-10-CM

## 2017-01-24 DIAGNOSIS — R7309 Other abnormal glucose: Secondary | ICD-10-CM | POA: Diagnosis not present

## 2017-01-24 DIAGNOSIS — E782 Mixed hyperlipidemia: Secondary | ICD-10-CM | POA: Diagnosis not present

## 2017-01-24 DIAGNOSIS — E039 Hypothyroidism, unspecified: Secondary | ICD-10-CM | POA: Diagnosis not present

## 2017-01-24 DIAGNOSIS — Z1389 Encounter for screening for other disorder: Secondary | ICD-10-CM | POA: Diagnosis not present

## 2017-01-24 DIAGNOSIS — C50919 Malignant neoplasm of unspecified site of unspecified female breast: Secondary | ICD-10-CM | POA: Diagnosis not present

## 2017-01-24 DIAGNOSIS — G309 Alzheimer's disease, unspecified: Secondary | ICD-10-CM | POA: Diagnosis not present

## 2017-03-16 DIAGNOSIS — H401132 Primary open-angle glaucoma, bilateral, moderate stage: Secondary | ICD-10-CM | POA: Diagnosis not present

## 2017-03-16 DIAGNOSIS — Q150 Congenital glaucoma: Secondary | ICD-10-CM | POA: Diagnosis not present

## 2017-03-21 ENCOUNTER — Other Ambulatory Visit: Payer: Self-pay | Admitting: Family Medicine

## 2017-03-21 ENCOUNTER — Ambulatory Visit
Admission: RE | Admit: 2017-03-21 | Discharge: 2017-03-21 | Disposition: A | Discharge status: Home / Self Care | Payer: Medicare Other | Source: Ambulatory Visit | Attending: Family Medicine | Admitting: Family Medicine

## 2017-03-21 DIAGNOSIS — Z23 Encounter for immunization: Secondary | ICD-10-CM | POA: Diagnosis not present

## 2017-03-21 DIAGNOSIS — M79601 Pain in right arm: Secondary | ICD-10-CM

## 2017-03-21 DIAGNOSIS — M19011 Primary osteoarthritis, right shoulder: Secondary | ICD-10-CM | POA: Diagnosis not present

## 2017-03-21 DIAGNOSIS — M79621 Pain in right upper arm: Secondary | ICD-10-CM | POA: Diagnosis not present

## 2017-03-25 DIAGNOSIS — M67911 Unspecified disorder of synovium and tendon, right shoulder: Secondary | ICD-10-CM | POA: Diagnosis not present

## 2017-03-29 DIAGNOSIS — M65811 Other synovitis and tenosynovitis, right shoulder: Secondary | ICD-10-CM | POA: Diagnosis not present

## 2017-04-01 DIAGNOSIS — M65811 Other synovitis and tenosynovitis, right shoulder: Secondary | ICD-10-CM | POA: Diagnosis not present

## 2017-04-05 DIAGNOSIS — M65811 Other synovitis and tenosynovitis, right shoulder: Secondary | ICD-10-CM | POA: Diagnosis not present

## 2017-04-08 DIAGNOSIS — M65811 Other synovitis and tenosynovitis, right shoulder: Secondary | ICD-10-CM | POA: Diagnosis not present

## 2017-04-12 DIAGNOSIS — M65811 Other synovitis and tenosynovitis, right shoulder: Secondary | ICD-10-CM | POA: Diagnosis not present

## 2017-04-15 DIAGNOSIS — M67911 Unspecified disorder of synovium and tendon, right shoulder: Secondary | ICD-10-CM | POA: Diagnosis not present

## 2017-05-19 DIAGNOSIS — G301 Alzheimer's disease with late onset: Secondary | ICD-10-CM | POA: Diagnosis not present

## 2017-05-19 DIAGNOSIS — F0280 Dementia in other diseases classified elsewhere without behavioral disturbance: Secondary | ICD-10-CM | POA: Diagnosis not present

## 2017-05-19 DIAGNOSIS — I680 Cerebral amyloid angiopathy: Secondary | ICD-10-CM | POA: Diagnosis not present

## 2017-05-19 DIAGNOSIS — E854 Organ-limited amyloidosis: Secondary | ICD-10-CM | POA: Diagnosis not present

## 2017-08-09 DIAGNOSIS — H401111 Primary open-angle glaucoma, right eye, mild stage: Secondary | ICD-10-CM | POA: Diagnosis not present

## 2017-08-09 DIAGNOSIS — H401123 Primary open-angle glaucoma, left eye, severe stage: Secondary | ICD-10-CM | POA: Diagnosis not present

## 2017-08-18 DIAGNOSIS — C50919 Malignant neoplasm of unspecified site of unspecified female breast: Secondary | ICD-10-CM | POA: Diagnosis not present

## 2017-08-18 DIAGNOSIS — R7303 Prediabetes: Secondary | ICD-10-CM | POA: Diagnosis not present

## 2017-08-18 DIAGNOSIS — E782 Mixed hyperlipidemia: Secondary | ICD-10-CM | POA: Diagnosis not present

## 2017-08-18 DIAGNOSIS — E039 Hypothyroidism, unspecified: Secondary | ICD-10-CM | POA: Diagnosis not present

## 2017-08-18 DIAGNOSIS — G309 Alzheimer's disease, unspecified: Secondary | ICD-10-CM | POA: Diagnosis not present

## 2017-10-19 ENCOUNTER — Other Ambulatory Visit: Payer: Self-pay | Admitting: Hematology and Oncology

## 2017-10-19 DIAGNOSIS — C50411 Malignant neoplasm of upper-outer quadrant of right female breast: Secondary | ICD-10-CM

## 2017-10-20 NOTE — Telephone Encounter (Signed)
Patient needs a yearly visit prior to any more refills.

## 2017-11-14 ENCOUNTER — Other Ambulatory Visit: Payer: Self-pay | Admitting: Hematology and Oncology

## 2017-11-14 DIAGNOSIS — C50411 Malignant neoplasm of upper-outer quadrant of right female breast: Secondary | ICD-10-CM

## 2018-01-03 DIAGNOSIS — R399 Unspecified symptoms and signs involving the genitourinary system: Secondary | ICD-10-CM | POA: Diagnosis not present

## 2018-01-24 DIAGNOSIS — H401122 Primary open-angle glaucoma, left eye, moderate stage: Secondary | ICD-10-CM | POA: Diagnosis not present

## 2018-01-24 DIAGNOSIS — H401111 Primary open-angle glaucoma, right eye, mild stage: Secondary | ICD-10-CM | POA: Diagnosis not present

## 2018-03-03 DIAGNOSIS — Z1389 Encounter for screening for other disorder: Secondary | ICD-10-CM | POA: Diagnosis not present

## 2018-03-03 DIAGNOSIS — G309 Alzheimer's disease, unspecified: Secondary | ICD-10-CM | POA: Diagnosis not present

## 2018-03-03 DIAGNOSIS — R7303 Prediabetes: Secondary | ICD-10-CM | POA: Diagnosis not present

## 2018-03-03 DIAGNOSIS — N390 Urinary tract infection, site not specified: Secondary | ICD-10-CM | POA: Diagnosis not present

## 2018-03-03 DIAGNOSIS — C50919 Malignant neoplasm of unspecified site of unspecified female breast: Secondary | ICD-10-CM | POA: Diagnosis not present

## 2018-03-03 DIAGNOSIS — E782 Mixed hyperlipidemia: Secondary | ICD-10-CM | POA: Diagnosis not present

## 2018-03-03 DIAGNOSIS — R030 Elevated blood-pressure reading, without diagnosis of hypertension: Secondary | ICD-10-CM | POA: Diagnosis not present

## 2018-03-03 DIAGNOSIS — E039 Hypothyroidism, unspecified: Secondary | ICD-10-CM | POA: Diagnosis not present

## 2018-03-03 DIAGNOSIS — Z1211 Encounter for screening for malignant neoplasm of colon: Secondary | ICD-10-CM | POA: Diagnosis not present

## 2018-03-03 DIAGNOSIS — Z0000 Encounter for general adult medical examination without abnormal findings: Secondary | ICD-10-CM | POA: Diagnosis not present

## 2018-03-16 DIAGNOSIS — Z23 Encounter for immunization: Secondary | ICD-10-CM | POA: Diagnosis not present

## 2018-03-24 DIAGNOSIS — Z111 Encounter for screening for respiratory tuberculosis: Secondary | ICD-10-CM | POA: Diagnosis not present

## 2018-03-27 DIAGNOSIS — G301 Alzheimer's disease with late onset: Secondary | ICD-10-CM | POA: Diagnosis not present

## 2018-03-27 DIAGNOSIS — E854 Organ-limited amyloidosis: Secondary | ICD-10-CM | POA: Diagnosis not present

## 2018-03-27 DIAGNOSIS — F0280 Dementia in other diseases classified elsewhere without behavioral disturbance: Secondary | ICD-10-CM | POA: Diagnosis not present

## 2018-03-27 DIAGNOSIS — I680 Cerebral amyloid angiopathy: Secondary | ICD-10-CM | POA: Diagnosis not present

## 2018-04-06 DIAGNOSIS — G300 Alzheimer's disease with early onset: Secondary | ICD-10-CM | POA: Diagnosis not present

## 2018-04-06 DIAGNOSIS — E782 Mixed hyperlipidemia: Secondary | ICD-10-CM | POA: Diagnosis not present

## 2018-04-06 DIAGNOSIS — F918 Other conduct disorders: Secondary | ICD-10-CM | POA: Diagnosis not present

## 2018-04-06 DIAGNOSIS — F5102 Adjustment insomnia: Secondary | ICD-10-CM | POA: Diagnosis not present

## 2018-04-13 DIAGNOSIS — R5383 Other fatigue: Secondary | ICD-10-CM | POA: Diagnosis not present

## 2018-04-13 DIAGNOSIS — F918 Other conduct disorders: Secondary | ICD-10-CM | POA: Diagnosis not present

## 2018-04-13 DIAGNOSIS — F5102 Adjustment insomnia: Secondary | ICD-10-CM | POA: Diagnosis not present

## 2018-04-13 DIAGNOSIS — G300 Alzheimer's disease with early onset: Secondary | ICD-10-CM | POA: Diagnosis not present

## 2018-04-15 DIAGNOSIS — N390 Urinary tract infection, site not specified: Secondary | ICD-10-CM | POA: Diagnosis not present

## 2018-04-15 DIAGNOSIS — R410 Disorientation, unspecified: Secondary | ICD-10-CM | POA: Diagnosis not present

## 2018-04-19 DIAGNOSIS — W19XXXA Unspecified fall, initial encounter: Secondary | ICD-10-CM | POA: Diagnosis not present

## 2018-04-19 DIAGNOSIS — S0083XA Contusion of other part of head, initial encounter: Secondary | ICD-10-CM | POA: Diagnosis not present

## 2018-04-19 DIAGNOSIS — S01112A Laceration without foreign body of left eyelid and periocular area, initial encounter: Secondary | ICD-10-CM | POA: Diagnosis not present

## 2018-04-19 DIAGNOSIS — S0093XA Contusion of unspecified part of head, initial encounter: Secondary | ICD-10-CM | POA: Diagnosis not present

## 2018-04-19 DIAGNOSIS — Y92129 Unspecified place in nursing home as the place of occurrence of the external cause: Secondary | ICD-10-CM | POA: Diagnosis not present

## 2018-04-19 DIAGNOSIS — S0181XA Laceration without foreign body of other part of head, initial encounter: Secondary | ICD-10-CM | POA: Diagnosis not present

## 2018-04-19 DIAGNOSIS — S0191XA Laceration without foreign body of unspecified part of head, initial encounter: Secondary | ICD-10-CM | POA: Diagnosis not present

## 2018-04-19 DIAGNOSIS — S1980XA Other specified injuries of unspecified part of neck, initial encounter: Secondary | ICD-10-CM | POA: Diagnosis not present

## 2018-04-19 DIAGNOSIS — F0390 Unspecified dementia without behavioral disturbance: Secondary | ICD-10-CM | POA: Diagnosis not present

## 2018-04-20 DIAGNOSIS — F29 Unspecified psychosis not due to a substance or known physiological condition: Secondary | ICD-10-CM | POA: Diagnosis not present

## 2018-04-20 DIAGNOSIS — F0151 Vascular dementia with behavioral disturbance: Secondary | ICD-10-CM | POA: Diagnosis not present

## 2018-04-20 DIAGNOSIS — W19XXXA Unspecified fall, initial encounter: Secondary | ICD-10-CM | POA: Diagnosis not present

## 2018-04-20 DIAGNOSIS — N390 Urinary tract infection, site not specified: Secondary | ICD-10-CM | POA: Diagnosis not present

## 2018-04-20 DIAGNOSIS — R419 Unspecified symptoms and signs involving cognitive functions and awareness: Secondary | ICD-10-CM | POA: Diagnosis not present

## 2018-04-20 DIAGNOSIS — F918 Other conduct disorders: Secondary | ICD-10-CM | POA: Diagnosis not present

## 2018-04-21 DIAGNOSIS — L600 Ingrowing nail: Secondary | ICD-10-CM | POA: Diagnosis not present

## 2018-04-21 DIAGNOSIS — M79674 Pain in right toe(s): Secondary | ICD-10-CM | POA: Diagnosis not present

## 2018-04-21 DIAGNOSIS — M2041 Other hammer toe(s) (acquired), right foot: Secondary | ICD-10-CM | POA: Diagnosis not present

## 2018-04-21 DIAGNOSIS — M79675 Pain in left toe(s): Secondary | ICD-10-CM | POA: Diagnosis not present

## 2018-04-21 DIAGNOSIS — B351 Tinea unguium: Secondary | ICD-10-CM | POA: Diagnosis not present

## 2018-04-21 DIAGNOSIS — M2042 Other hammer toe(s) (acquired), left foot: Secondary | ICD-10-CM | POA: Diagnosis not present

## 2018-04-28 DIAGNOSIS — R419 Unspecified symptoms and signs involving cognitive functions and awareness: Secondary | ICD-10-CM | POA: Diagnosis not present

## 2018-04-28 DIAGNOSIS — F29 Unspecified psychosis not due to a substance or known physiological condition: Secondary | ICD-10-CM | POA: Diagnosis not present

## 2018-04-28 DIAGNOSIS — F918 Other conduct disorders: Secondary | ICD-10-CM | POA: Diagnosis not present

## 2018-04-28 DIAGNOSIS — N390 Urinary tract infection, site not specified: Secondary | ICD-10-CM | POA: Diagnosis not present

## 2018-04-28 DIAGNOSIS — F0151 Vascular dementia with behavioral disturbance: Secondary | ICD-10-CM | POA: Diagnosis not present

## 2018-04-28 DIAGNOSIS — W19XXXA Unspecified fall, initial encounter: Secondary | ICD-10-CM | POA: Diagnosis not present

## 2018-05-04 DIAGNOSIS — F918 Other conduct disorders: Secondary | ICD-10-CM | POA: Diagnosis not present

## 2018-05-04 DIAGNOSIS — M6281 Muscle weakness (generalized): Secondary | ICD-10-CM | POA: Diagnosis not present

## 2018-05-04 DIAGNOSIS — F0151 Vascular dementia with behavioral disturbance: Secondary | ICD-10-CM | POA: Diagnosis not present

## 2018-05-04 DIAGNOSIS — F29 Unspecified psychosis not due to a substance or known physiological condition: Secondary | ICD-10-CM | POA: Diagnosis not present

## 2018-05-07 DIAGNOSIS — Z880 Allergy status to penicillin: Secondary | ICD-10-CM | POA: Diagnosis not present

## 2018-05-07 DIAGNOSIS — S8991XA Unspecified injury of right lower leg, initial encounter: Secondary | ICD-10-CM | POA: Diagnosis not present

## 2018-05-07 DIAGNOSIS — S8001XA Contusion of right knee, initial encounter: Secondary | ICD-10-CM | POA: Diagnosis not present

## 2018-05-07 DIAGNOSIS — M79605 Pain in left leg: Secondary | ICD-10-CM | POA: Diagnosis not present

## 2018-05-07 DIAGNOSIS — W19XXXA Unspecified fall, initial encounter: Secondary | ICD-10-CM | POA: Diagnosis not present

## 2018-05-07 DIAGNOSIS — S8992XA Unspecified injury of left lower leg, initial encounter: Secondary | ICD-10-CM | POA: Diagnosis not present

## 2018-05-07 DIAGNOSIS — F0390 Unspecified dementia without behavioral disturbance: Secondary | ICD-10-CM | POA: Diagnosis not present

## 2018-05-07 DIAGNOSIS — S8002XA Contusion of left knee, initial encounter: Secondary | ICD-10-CM | POA: Diagnosis not present

## 2018-05-13 DIAGNOSIS — S3993XA Unspecified injury of pelvis, initial encounter: Secondary | ICD-10-CM | POA: Diagnosis not present

## 2018-05-13 DIAGNOSIS — S299XXA Unspecified injury of thorax, initial encounter: Secondary | ICD-10-CM | POA: Diagnosis not present

## 2018-05-13 DIAGNOSIS — F0390 Unspecified dementia without behavioral disturbance: Secondary | ICD-10-CM | POA: Diagnosis not present

## 2018-05-13 DIAGNOSIS — S0990XA Unspecified injury of head, initial encounter: Secondary | ICD-10-CM | POA: Diagnosis not present

## 2018-05-13 DIAGNOSIS — Z880 Allergy status to penicillin: Secondary | ICD-10-CM | POA: Diagnosis not present

## 2018-05-13 DIAGNOSIS — R4182 Altered mental status, unspecified: Secondary | ICD-10-CM | POA: Diagnosis not present

## 2018-05-13 DIAGNOSIS — S0093XA Contusion of unspecified part of head, initial encounter: Secondary | ICD-10-CM | POA: Diagnosis not present

## 2018-05-13 DIAGNOSIS — Z7401 Bed confinement status: Secondary | ICD-10-CM | POA: Diagnosis not present

## 2018-05-13 DIAGNOSIS — R296 Repeated falls: Secondary | ICD-10-CM | POA: Diagnosis not present

## 2018-05-13 DIAGNOSIS — I771 Stricture of artery: Secondary | ICD-10-CM | POA: Diagnosis not present

## 2018-05-13 DIAGNOSIS — M8580 Other specified disorders of bone density and structure, unspecified site: Secondary | ICD-10-CM | POA: Diagnosis not present

## 2018-05-15 DIAGNOSIS — G9389 Other specified disorders of brain: Secondary | ICD-10-CM | POA: Diagnosis not present

## 2018-05-15 DIAGNOSIS — J439 Emphysema, unspecified: Secondary | ICD-10-CM | POA: Diagnosis not present

## 2018-05-15 DIAGNOSIS — M47812 Spondylosis without myelopathy or radiculopathy, cervical region: Secondary | ICD-10-CM | POA: Diagnosis not present

## 2018-05-15 DIAGNOSIS — Z79890 Hormone replacement therapy (postmenopausal): Secondary | ICD-10-CM | POA: Diagnosis not present

## 2018-05-15 DIAGNOSIS — R4182 Altered mental status, unspecified: Secondary | ICD-10-CM | POA: Diagnosis not present

## 2018-05-15 DIAGNOSIS — R5381 Other malaise: Secondary | ICD-10-CM | POA: Diagnosis not present

## 2018-05-15 DIAGNOSIS — S0081XA Abrasion of other part of head, initial encounter: Secondary | ICD-10-CM | POA: Diagnosis not present

## 2018-05-15 DIAGNOSIS — F0390 Unspecified dementia without behavioral disturbance: Secondary | ICD-10-CM | POA: Diagnosis not present

## 2018-05-15 DIAGNOSIS — R296 Repeated falls: Secondary | ICD-10-CM | POA: Diagnosis not present

## 2018-05-15 DIAGNOSIS — Z7401 Bed confinement status: Secondary | ICD-10-CM | POA: Diagnosis not present

## 2018-05-15 DIAGNOSIS — Z79899 Other long term (current) drug therapy: Secondary | ICD-10-CM | POA: Diagnosis not present

## 2018-05-15 DIAGNOSIS — S199XXA Unspecified injury of neck, initial encounter: Secondary | ICD-10-CM | POA: Diagnosis not present

## 2018-05-15 DIAGNOSIS — R918 Other nonspecific abnormal finding of lung field: Secondary | ICD-10-CM | POA: Diagnosis not present

## 2018-05-17 DIAGNOSIS — R296 Repeated falls: Secondary | ICD-10-CM | POA: Diagnosis not present

## 2018-05-17 DIAGNOSIS — M6281 Muscle weakness (generalized): Secondary | ICD-10-CM | POA: Diagnosis not present

## 2018-05-17 DIAGNOSIS — G300 Alzheimer's disease with early onset: Secondary | ICD-10-CM | POA: Diagnosis not present

## 2018-05-17 DIAGNOSIS — W01198A Fall on same level from slipping, tripping and stumbling with subsequent striking against other object, initial encounter: Secondary | ICD-10-CM | POA: Diagnosis not present

## 2018-05-21 DIAGNOSIS — Z79899 Other long term (current) drug therapy: Secondary | ICD-10-CM | POA: Diagnosis not present

## 2018-05-21 DIAGNOSIS — Z9181 History of falling: Secondary | ICD-10-CM | POA: Diagnosis not present

## 2018-05-21 DIAGNOSIS — S0003XA Contusion of scalp, initial encounter: Secondary | ICD-10-CM | POA: Diagnosis not present

## 2018-05-21 DIAGNOSIS — S098XXA Other specified injuries of head, initial encounter: Secondary | ICD-10-CM | POA: Diagnosis not present

## 2018-05-21 DIAGNOSIS — S0990XA Unspecified injury of head, initial encounter: Secondary | ICD-10-CM | POA: Diagnosis not present

## 2018-05-21 DIAGNOSIS — R410 Disorientation, unspecified: Secondary | ICD-10-CM | POA: Diagnosis not present

## 2018-05-24 DIAGNOSIS — Z880 Allergy status to penicillin: Secondary | ICD-10-CM | POA: Diagnosis not present

## 2018-05-24 DIAGNOSIS — R531 Weakness: Secondary | ICD-10-CM | POA: Diagnosis not present

## 2018-05-24 DIAGNOSIS — G9389 Other specified disorders of brain: Secondary | ICD-10-CM | POA: Diagnosis not present

## 2018-05-24 DIAGNOSIS — F0281 Dementia in other diseases classified elsewhere with behavioral disturbance: Secondary | ICD-10-CM | POA: Diagnosis not present

## 2018-05-24 DIAGNOSIS — R296 Repeated falls: Secondary | ICD-10-CM | POA: Diagnosis not present

## 2018-05-24 DIAGNOSIS — F0280 Dementia in other diseases classified elsewhere without behavioral disturbance: Secondary | ICD-10-CM | POA: Diagnosis not present

## 2018-05-24 DIAGNOSIS — R1312 Dysphagia, oropharyngeal phase: Secondary | ICD-10-CM | POA: Diagnosis not present

## 2018-05-24 DIAGNOSIS — R0603 Acute respiratory distress: Secondary | ICD-10-CM | POA: Diagnosis not present

## 2018-05-24 DIAGNOSIS — Z87440 Personal history of urinary (tract) infections: Secondary | ICD-10-CM | POA: Diagnosis not present

## 2018-05-24 DIAGNOSIS — E860 Dehydration: Secondary | ICD-10-CM | POA: Diagnosis not present

## 2018-05-24 DIAGNOSIS — G301 Alzheimer's disease with late onset: Secondary | ICD-10-CM | POA: Diagnosis not present

## 2018-05-24 DIAGNOSIS — E785 Hyperlipidemia, unspecified: Secondary | ICD-10-CM | POA: Diagnosis not present

## 2018-05-24 DIAGNOSIS — Z7401 Bed confinement status: Secondary | ICD-10-CM | POA: Diagnosis not present

## 2018-05-24 DIAGNOSIS — R4182 Altered mental status, unspecified: Secondary | ICD-10-CM | POA: Diagnosis not present

## 2018-05-24 DIAGNOSIS — F0390 Unspecified dementia without behavioral disturbance: Secondary | ICD-10-CM | POA: Diagnosis not present

## 2018-05-24 DIAGNOSIS — R404 Transient alteration of awareness: Secondary | ICD-10-CM | POA: Diagnosis not present

## 2018-05-24 DIAGNOSIS — E039 Hypothyroidism, unspecified: Secondary | ICD-10-CM | POA: Diagnosis not present

## 2018-05-25 DIAGNOSIS — Z87440 Personal history of urinary (tract) infections: Secondary | ICD-10-CM | POA: Diagnosis not present

## 2018-05-25 DIAGNOSIS — G301 Alzheimer's disease with late onset: Secondary | ICD-10-CM | POA: Diagnosis not present

## 2018-05-25 DIAGNOSIS — F0280 Dementia in other diseases classified elsewhere without behavioral disturbance: Secondary | ICD-10-CM | POA: Diagnosis not present

## 2018-05-25 DIAGNOSIS — E785 Hyperlipidemia, unspecified: Secondary | ICD-10-CM | POA: Diagnosis not present

## 2018-05-25 DIAGNOSIS — E039 Hypothyroidism, unspecified: Secondary | ICD-10-CM | POA: Diagnosis not present

## 2018-05-25 DIAGNOSIS — R296 Repeated falls: Secondary | ICD-10-CM | POA: Diagnosis not present

## 2018-05-26 DIAGNOSIS — R296 Repeated falls: Secondary | ICD-10-CM | POA: Diagnosis not present

## 2018-05-26 DIAGNOSIS — Z87440 Personal history of urinary (tract) infections: Secondary | ICD-10-CM | POA: Diagnosis not present

## 2018-05-26 DIAGNOSIS — F0280 Dementia in other diseases classified elsewhere without behavioral disturbance: Secondary | ICD-10-CM | POA: Diagnosis not present

## 2018-05-26 DIAGNOSIS — E785 Hyperlipidemia, unspecified: Secondary | ICD-10-CM | POA: Diagnosis not present

## 2018-05-26 DIAGNOSIS — G301 Alzheimer's disease with late onset: Secondary | ICD-10-CM | POA: Diagnosis not present

## 2018-05-26 DIAGNOSIS — E039 Hypothyroidism, unspecified: Secondary | ICD-10-CM | POA: Diagnosis not present

## 2018-05-28 DIAGNOSIS — E785 Hyperlipidemia, unspecified: Secondary | ICD-10-CM | POA: Diagnosis not present

## 2018-05-28 DIAGNOSIS — R296 Repeated falls: Secondary | ICD-10-CM | POA: Diagnosis not present

## 2018-05-28 DIAGNOSIS — F0280 Dementia in other diseases classified elsewhere without behavioral disturbance: Secondary | ICD-10-CM | POA: Diagnosis not present

## 2018-05-28 DIAGNOSIS — Z87440 Personal history of urinary (tract) infections: Secondary | ICD-10-CM | POA: Diagnosis not present

## 2018-05-28 DIAGNOSIS — G301 Alzheimer's disease with late onset: Secondary | ICD-10-CM | POA: Diagnosis not present

## 2018-05-28 DIAGNOSIS — E039 Hypothyroidism, unspecified: Secondary | ICD-10-CM | POA: Diagnosis not present

## 2018-06-21 DEATH — deceased

## 2019-09-27 IMAGING — CR DG HUMERUS 2V *R*
2 series · 2 of 2 positions shown · non-contrast
Comparison: None.

CLINICAL DATA: Right arm pain for 1 month.  No known injury.

EXAM:
RIGHT HUMERUS - 2+ VIEW

[w humerus ap right *]
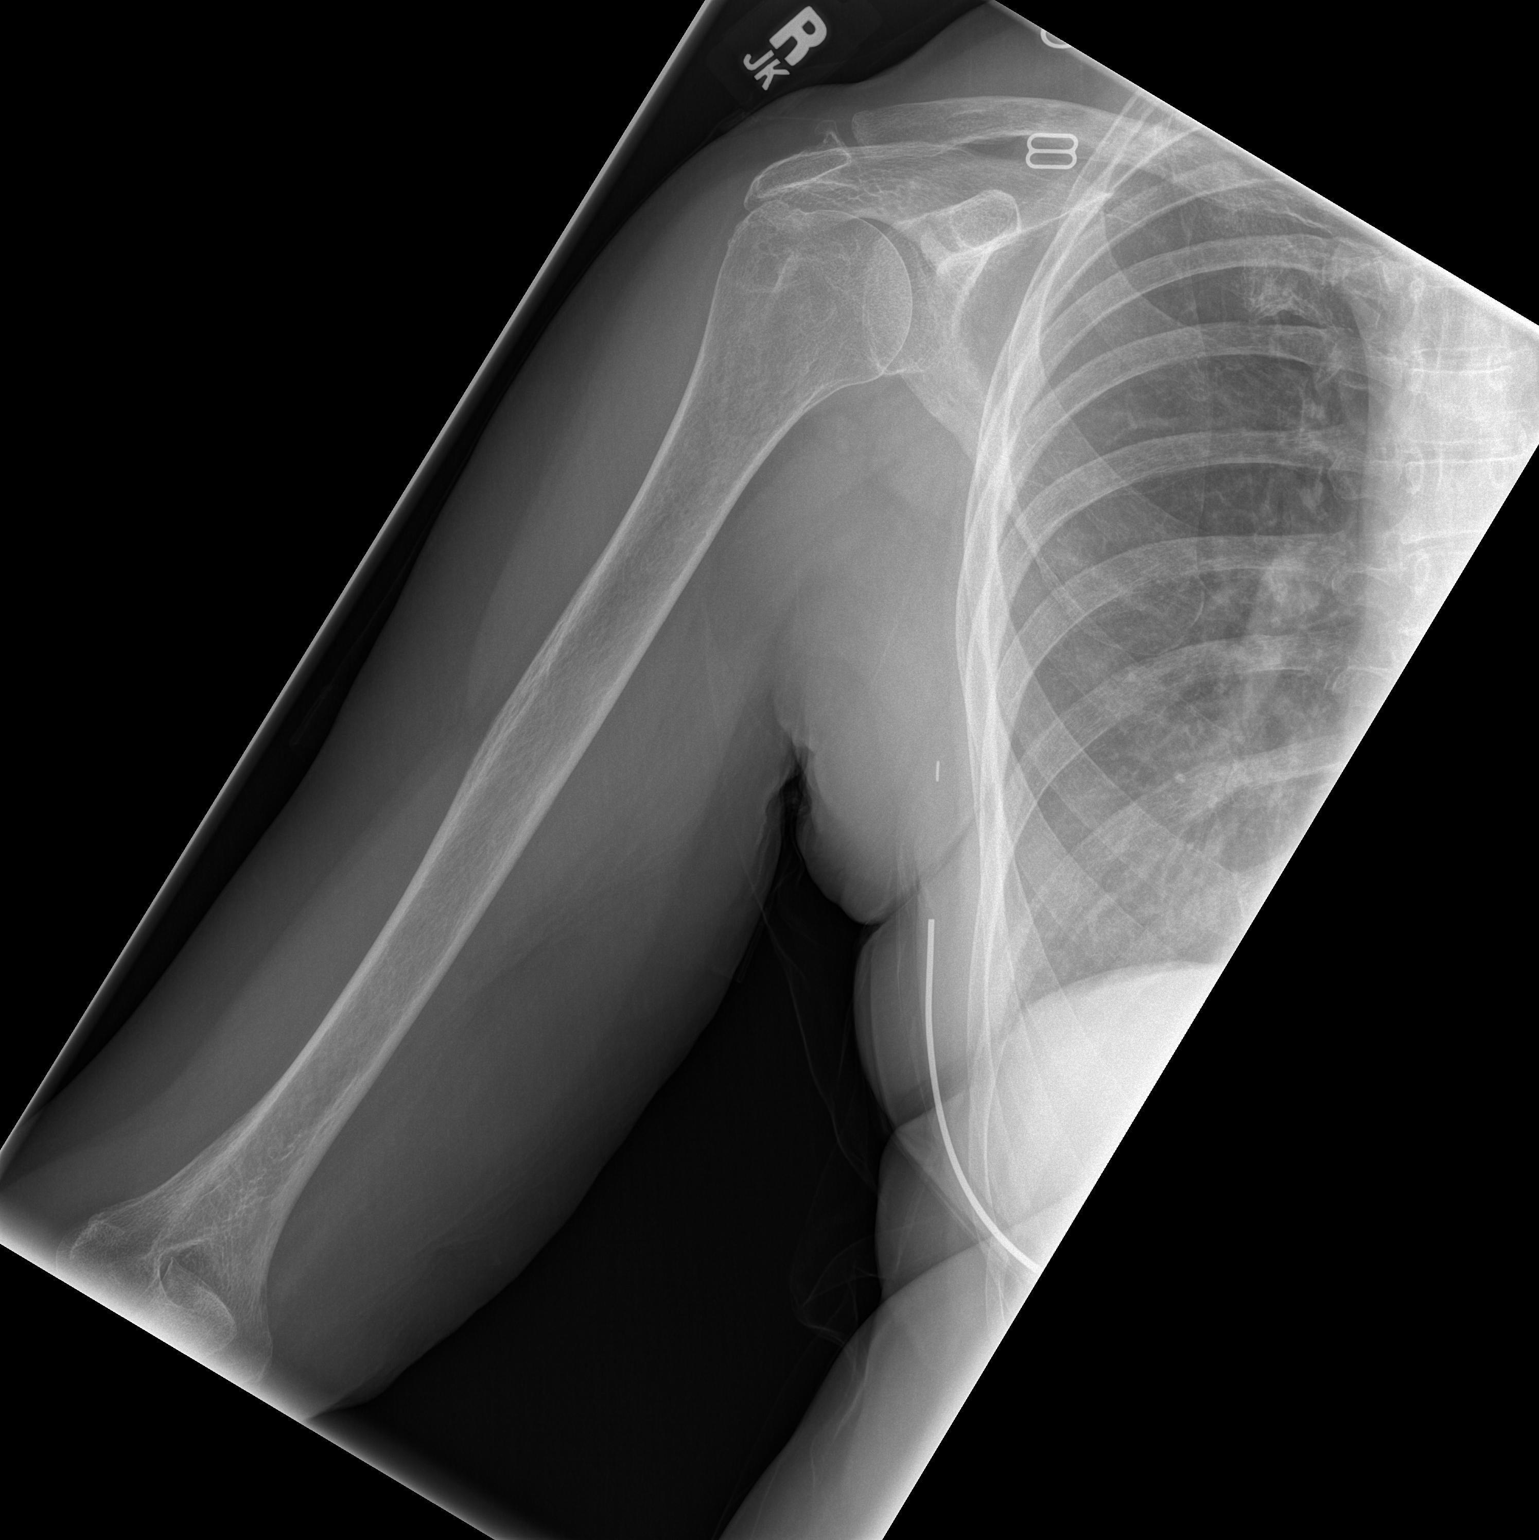

[w humerus lat right *]
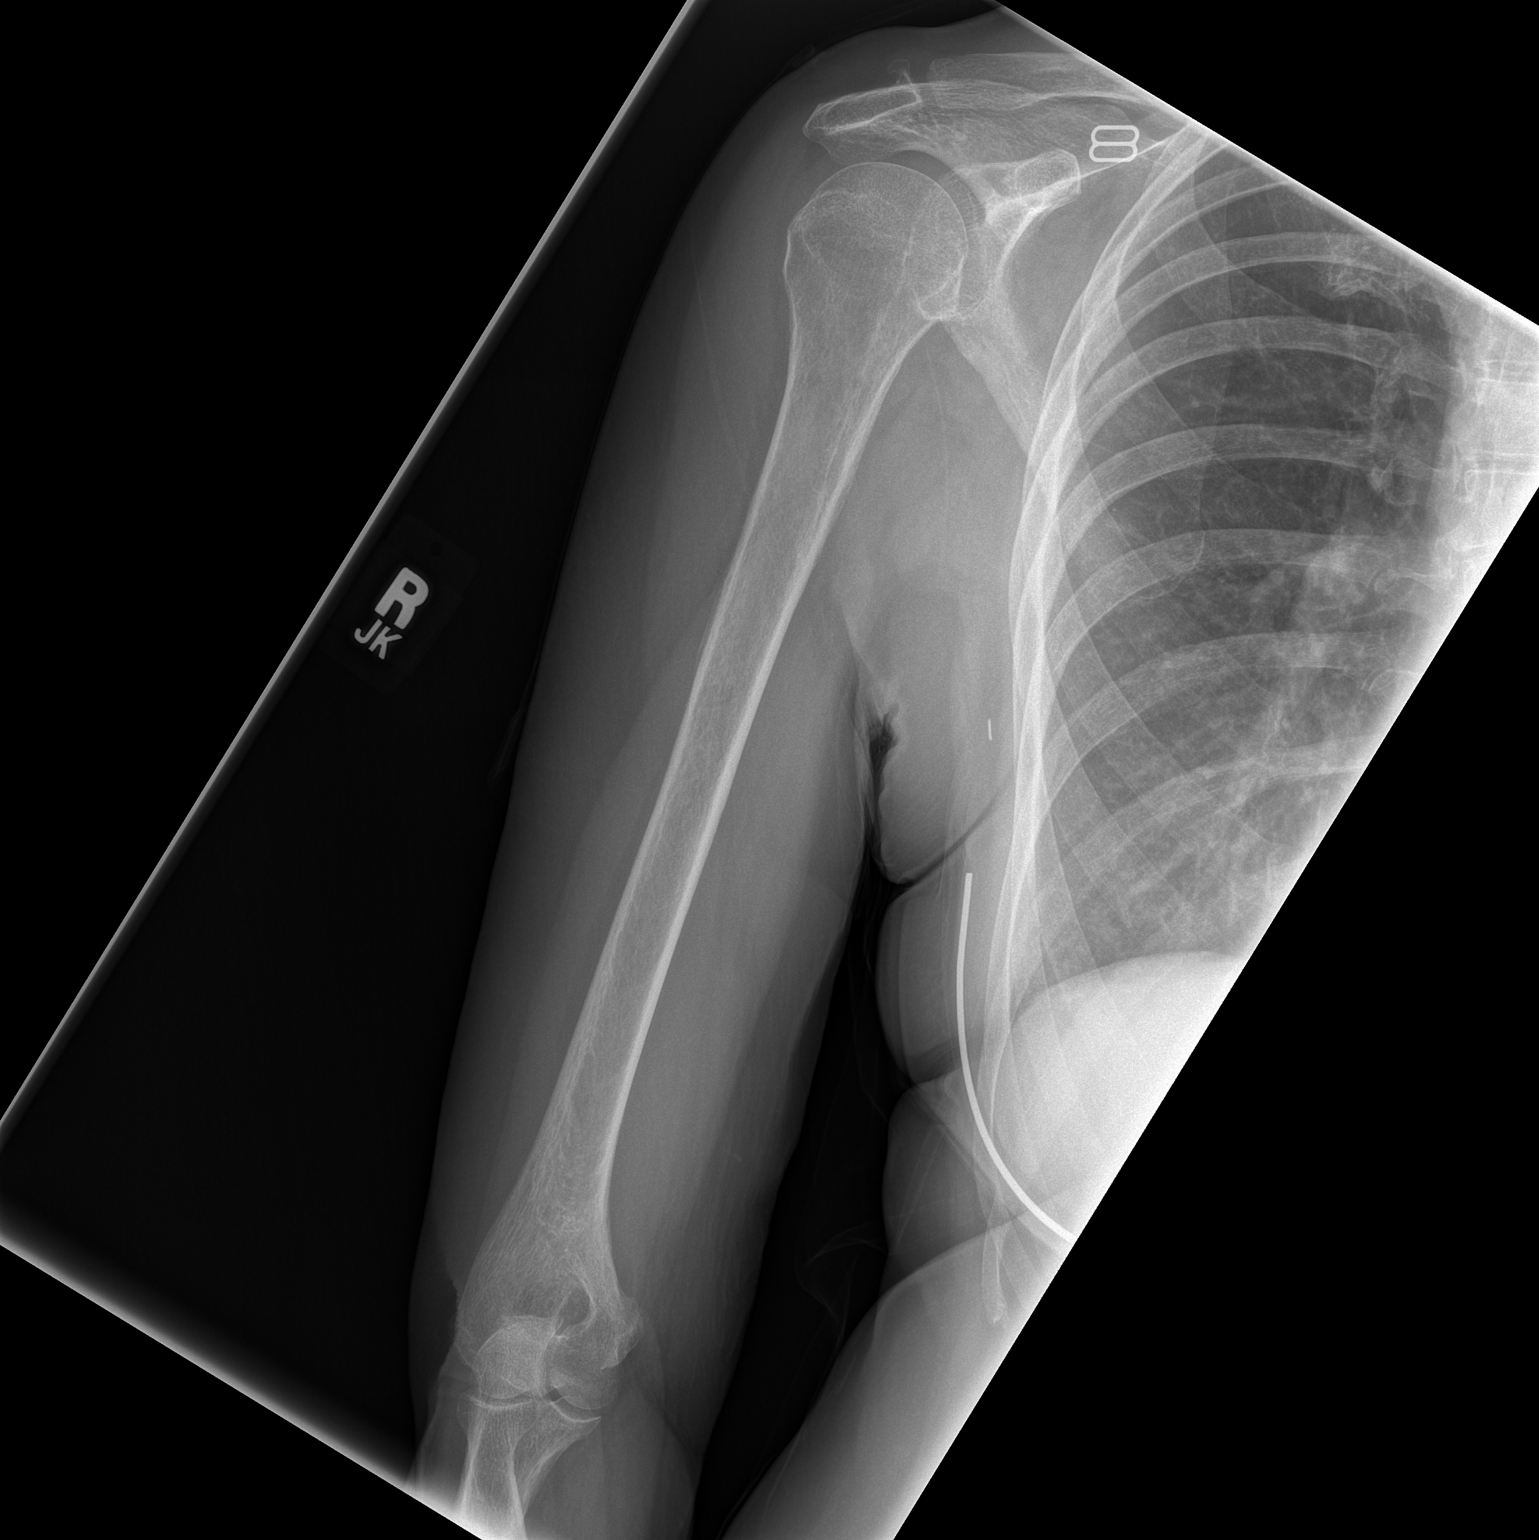

[2 of 2 positions shown; findings below may reference images not displayed]

FINDINGS: No acute fracture dislocation. Generalized osteopenia. Mild
arthropathy of the acromioclavicular joint.
IMPRESSION: No acute osseous injury of the right humerus.
